# Patient Record
Sex: Female | Born: 1975 | Race: White | Hispanic: No | Marital: Single | Smoking: Current every day smoker
Health system: Southern US, Community
[De-identification: ages and names within clinical notes are randomized; demographics above are authoritative.]

## PROBLEM LIST (undated history)

## (undated) DIAGNOSIS — F329 Major depressive disorder, single episode, unspecified: Secondary | ICD-10-CM

## (undated) DIAGNOSIS — G43909 Migraine, unspecified, not intractable, without status migrainosus: Secondary | ICD-10-CM

## (undated) DIAGNOSIS — Z87442 Personal history of urinary calculi: Secondary | ICD-10-CM

## (undated) DIAGNOSIS — F32A Depression, unspecified: Secondary | ICD-10-CM

## (undated) DIAGNOSIS — I10 Essential (primary) hypertension: Secondary | ICD-10-CM

---

## 2007-05-31 ENCOUNTER — Emergency Department: Payer: Self-pay | Admitting: Emergency Medicine

## 2010-04-07 ENCOUNTER — Emergency Department: Payer: Self-pay | Admitting: Emergency Medicine

## 2011-05-03 ENCOUNTER — Emergency Department: Payer: Self-pay | Admitting: Unknown Physician Specialty

## 2011-05-11 ENCOUNTER — Emergency Department: Payer: Self-pay | Admitting: *Deleted

## 2012-07-05 ENCOUNTER — Emergency Department: Payer: Self-pay | Admitting: Emergency Medicine

## 2012-07-05 LAB — COMPREHENSIVE METABOLIC PANEL
Albumin: 3.5 g/dL (ref 3.4–5.0)
Alkaline Phosphatase: 92 U/L (ref 50–136)
BUN: 14 mg/dL (ref 7–18)
Bilirubin,Total: 0.2 mg/dL (ref 0.2–1.0)
Chloride: 109 mmol/L — ABNORMAL HIGH (ref 98–107)
Co2: 24 mmol/L (ref 21–32)
EGFR (African American): >60
Glucose: 78 mg/dL (ref 65–99)
Osmolality: 282 (ref 275–301)
SGOT(AST): 24 U/L (ref 15–37)
Total Protein: 7.6 g/dL (ref 6.4–8.2)

## 2012-07-05 LAB — URINALYSIS, COMPLETE
Bilirubin,UR: NEGATIVE
Glucose,UR: NEGATIVE mg/dL (ref 0–75)
Ph: 6 (ref 4.5–8.0)
Protein: NEGATIVE
RBC,UR: 1 /HPF (ref 0–5)
Specific Gravity: 1.005 (ref 1.003–1.030)
Squamous Epithelial: 1
WBC UR: 2 /HPF (ref 0–5)

## 2012-07-05 LAB — CBC
HCT: 39.2 % (ref 35.0–47.0)
HGB: 12.6 g/dL (ref 12.0–16.0)
Platelet: 211 10*3/uL (ref 150–440)
RBC: 4.7 10*6/uL (ref 3.80–5.20)
RDW: 14.4 % (ref 11.5–14.5)

## 2013-03-22 ENCOUNTER — Ambulatory Visit: Payer: Self-pay | Admitting: Family Medicine

## 2013-05-03 ENCOUNTER — Ambulatory Visit: Payer: Self-pay | Admitting: Family Medicine

## 2013-07-15 ENCOUNTER — Emergency Department (HOSPITAL_COMMUNITY)
Admission: EM | Admit: 2013-07-15 | Discharge: 2013-07-15 | Disposition: A | Discharge status: Home / Self Care | Payer: Self-pay | Attending: Emergency Medicine | Admitting: Emergency Medicine

## 2013-07-15 ENCOUNTER — Encounter (HOSPITAL_COMMUNITY): Payer: Self-pay | Admitting: Emergency Medicine

## 2013-07-15 DIAGNOSIS — X500XXA Overexertion from strenuous movement or load, initial encounter: Secondary | ICD-10-CM | POA: Insufficient documentation

## 2013-07-15 DIAGNOSIS — F3289 Other specified depressive episodes: Secondary | ICD-10-CM | POA: Insufficient documentation

## 2013-07-15 DIAGNOSIS — S39012A Strain of muscle, fascia and tendon of lower back, initial encounter: Secondary | ICD-10-CM

## 2013-07-15 DIAGNOSIS — Z79899 Other long term (current) drug therapy: Secondary | ICD-10-CM | POA: Insufficient documentation

## 2013-07-15 DIAGNOSIS — F17200 Nicotine dependence, unspecified, uncomplicated: Secondary | ICD-10-CM | POA: Insufficient documentation

## 2013-07-15 DIAGNOSIS — F329 Major depressive disorder, single episode, unspecified: Secondary | ICD-10-CM | POA: Insufficient documentation

## 2013-07-15 DIAGNOSIS — Y9389 Activity, other specified: Secondary | ICD-10-CM | POA: Insufficient documentation

## 2013-07-15 DIAGNOSIS — S335XXA Sprain of ligaments of lumbar spine, initial encounter: Secondary | ICD-10-CM | POA: Insufficient documentation

## 2013-07-15 DIAGNOSIS — G43909 Migraine, unspecified, not intractable, without status migrainosus: Secondary | ICD-10-CM | POA: Insufficient documentation

## 2013-07-15 DIAGNOSIS — Y929 Unspecified place or not applicable: Secondary | ICD-10-CM | POA: Insufficient documentation

## 2013-07-15 HISTORY — DX: Migraine, unspecified, not intractable, without status migrainosus: G43.909

## 2013-07-15 HISTORY — DX: Major depressive disorder, single episode, unspecified: F32.9

## 2013-07-15 HISTORY — DX: Depression, unspecified: F32.A

## 2013-07-15 MED ORDER — METHOCARBAMOL 500 MG PO TABS: ORAL_TABLET | ORAL | Status: DC

## 2013-07-15 MED ORDER — HYDROCODONE-ACETAMINOPHEN 5-325 MG PO TABS: ORAL_TABLET | ORAL | Status: DC

## 2013-07-15 NOTE — ED Notes (Signed)
Low back pain with radiation down rt leg. Pt says she was standing on top of a washing machine and bent over to reach the cord, 2 days ago.  Burgess Estelle.  Yesterday began having pain in her back.

## 2013-07-15 NOTE — ED Notes (Signed)
Pt c/o lower back pain radiating down right leg.

## 2013-07-15 NOTE — ED Provider Notes (Signed)
CSN: [id number]     Arrival date & time 07/15/13  1833 History   First MD Initiated Contact with Patient 07/15/13 1849     Chief Complaint  Patient presents with  . Back Pain   (Consider location/radiation/quality/duration/timing/severity/associated sxs/prior Treatment) Patient is a 38 y.o. female presenting with back pain. The history is provided by the patient.  Back Pain Location:  Lumbar spine Quality:  Shooting and aching Radiates to:  R posterior upper leg and R thigh Pain severity:  Moderate Pain is:  Same all the time Onset quality:  Sudden Duration:  2 days Timing:  Constant Progression:  Unchanged Chronicity:  New Context: recent injury and twisting   Relieved by:  Nothing Worsened by:  Bending, twisting and standing Ineffective treatments:  None tried Associated symptoms: leg pain   Associated symptoms: no abdominal pain, no abdominal swelling, no bladder incontinence, no bowel incontinence, no chest pain, no dysuria, no fever, no headaches, no numbness, no paresthesias, no pelvic pain, no perianal numbness, no tingling and no weakness     Past Medical History  Diagnosis Date  . Migraine   . Depression    Past Surgical History  Procedure Laterality Date  . Cesarean section     No family history on file. History  Substance Use Topics  . Smoking status: Current Every Day Smoker -- 0.50 packs/day    Types: Cigarettes  . Smokeless tobacco: Not on file  . Alcohol Use: Yes     Comment: rare   OB History   Grav Para Term Preterm Abortions TAB SAB Ect Mult Living                 Review of Systems  Constitutional: Negative for fever.  Respiratory: Negative for chest tightness and shortness of breath.   Cardiovascular: Negative for chest pain.  Gastrointestinal: Negative for vomiting, abdominal pain, constipation and bowel incontinence.  Genitourinary: Negative for bladder incontinence, dysuria, hematuria, flank pain, decreased urine volume, difficulty  urinating and pelvic pain.       No perineal numbness or incontinence of urine or feces  Musculoskeletal: Positive for back pain. Negative for joint swelling.  Skin: Negative for rash.  Neurological: Negative for tingling, weakness, numbness, headaches and paresthesias.  All other systems reviewed and are negative.    Allergies  Review of patient's allergies indicates no known allergies.  Home Medications   Current Outpatient Rx  Name  Route  Sig  Dispense  Refill  . sertraline (ZOLOFT) 100 MG tablet   Oral   Take 150 mg by mouth daily.         Marland Kitchen. topiramate (TOPAMAX) 50 MG tablet   Oral   Take 50 mg by mouth 2 (two) times daily.          BP 147/90  Pulse 67  Temp(Src) 98.2 F (36.8 C)  Resp 18  Ht 5\' 6"  (1.676 m)  Wt 240 lb (108.863 kg)  BMI 38.76 kg/m2  SpO2 100%  LMP 07/05/2013 Physical Exam  Nursing note and vitals reviewed. Constitutional: She is oriented to person, place, and time. She appears well-developed and well-nourished. No distress.  HENT:  Head: Normocephalic and atraumatic.  Neck: Normal range of motion. Neck supple.  Cardiovascular: Normal rate, regular rhythm, normal heart sounds and intact distal pulses.   No murmur heard. Pulmonary/Chest: Effort normal and breath sounds normal. No respiratory distress.  Abdominal: Soft. She exhibits no distension. There is no tenderness.  Musculoskeletal: She exhibits tenderness. She exhibits no  edema.       Lumbar back: She exhibits tenderness and pain. She exhibits normal range of motion, no swelling, no deformity, no laceration and normal pulse.  ttp of the right lumbar paraspinal muscles and SI joint.  No spinal tenderness.  DP pulses are brisk and symmetrical.  Distal sensation intact.  Hip Flexors/Extensors are intact  Neurological: She is alert and oriented to person, place, and time. She has normal strength. No sensory deficit. She exhibits normal muscle tone. Coordination and gait normal.  Reflex  Scores:      Patellar reflexes are 2+ on the right side and 2+ on the left side.      Achilles reflexes are 2+ on the right side and 2+ on the left side. Skin: Skin is warm and dry. No rash noted.    ED Course  Procedures (including critical care time) Labs Review Labs Reviewed - No data to display Imaging Review No results found.  EKG Interpretation   None       MDM    Patient has ttp of the right lumbar paraspinal muscles.  No focal neuro deficits on exam.  Ambulates with a steady gait.   No concerning symptoms for emergent neurological or infectious process.  Pt agrees to symptomatic treatment with robaxin, vicodin # 12,   Marna Weniger L. Kaydie Petsch, PA-C 07/17/13 0106

## 2013-07-15 NOTE — Discharge Instructions (Signed)
Lumbosacral Strain Lumbosacral strain is a strain of any of the parts that make up your lumbosacral vertebrae. Your lumbosacral vertebrae are the bones that make up the lower third of your backbone. Your lumbosacral vertebrae are held together by muscles and tough, fibrous tissue (ligaments).  CAUSES  A sudden blow to your back can cause lumbosacral strain. Also, anything that causes an excessive stretch of the muscles in the low back can cause this strain. This is typically seen when people exert themselves strenuously, fall, lift heavy objects, bend, or crouch repeatedly. RISK FACTORS  Physically demanding work.  Participation in pushing or pulling sports or sports that require sudden twist of the back (tennis, golf, baseball).  Weight lifting.  Excessive lower back curvature.  Forward-tilted pelvis.  Weak back or abdominal muscles or both.  Tight hamstrings. SIGNS AND SYMPTOMS  Lumbosacral strain may cause pain in the area of your injury or pain that moves (radiates) down your leg.  DIAGNOSIS Your health care provider can often diagnose lumbosacral strain through a physical exam. In some cases, you may need tests such as X-ray exams.  TREATMENT  Treatment for your lower back injury depends on many factors that your clinician will have to evaluate. However, most treatment will include the use of anti-inflammatory medicines. HOME CARE INSTRUCTIONS   Avoid hard physical activities (tennis, racquetball, waterskiing) if you are not in proper physical condition for it. This may aggravate or create problems.  If you have a back problem, avoid sports requiring sudden body movements. Swimming and walking are generally safer activities.  Maintain good posture.  Maintain a healthy weight.  For acute conditions, you may put ice on the injured area.  Put ice in a plastic bag.  Place a towel between your skin and the bag.  Leave the ice on for 20 minutes, 2 3 times a day.  When the  low back starts healing, stretching and strengthening exercises may be recommended. SEEK MEDICAL CARE IF:  Your back pain is getting worse.  You experience severe back pain not relieved with medicines. SEEK IMMEDIATE MEDICAL CARE IF:   You have numbness, tingling, weakness, or problems with the use of your arms or legs.  There is a change in bowel or bladder control.  You have increasing pain in any area of the body, including your belly (abdomen).  You notice shortness of breath, dizziness, or feel faint.  You feel sick to your stomach (nauseous), are throwing up (vomiting), or become sweaty.  You notice discoloration of your toes or legs, or your feet get very cold. MAKE SURE YOU:   Understand these instructions.  Will watch your condition.  Will get help right away if you are not doing well or get worse. Document Released: 03/27/2005 Document Revised: 04/07/2013 Document Reviewed: 02/03/2013 [employer] Patient Information 2014 [employer].  

## 2013-07-19 NOTE — ED Provider Notes (Signed)
Medical screening examination/treatment/procedure(s) were performed by non-physician practitioner and as supervising physician I was immediately available for consultation/collaboration.  Christopher J. Pollina, MD 07/19/13 0757 

## 2013-07-24 ENCOUNTER — Encounter (HOSPITAL_COMMUNITY): Payer: Self-pay | Admitting: Emergency Medicine

## 2013-07-24 ENCOUNTER — Emergency Department (HOSPITAL_COMMUNITY)
Admission: EM | Admit: 2013-07-24 | Discharge: 2013-07-24 | Disposition: A | Discharge status: Home / Self Care | Payer: Self-pay | Attending: Emergency Medicine | Admitting: Emergency Medicine

## 2013-07-24 DIAGNOSIS — F3289 Other specified depressive episodes: Secondary | ICD-10-CM | POA: Insufficient documentation

## 2013-07-24 DIAGNOSIS — F329 Major depressive disorder, single episode, unspecified: Secondary | ICD-10-CM | POA: Insufficient documentation

## 2013-07-24 DIAGNOSIS — M5431 Sciatica, right side: Secondary | ICD-10-CM

## 2013-07-24 DIAGNOSIS — M5430 Sciatica, unspecified side: Secondary | ICD-10-CM | POA: Insufficient documentation

## 2013-07-24 DIAGNOSIS — IMO0002 Reserved for concepts with insufficient information to code with codable children: Secondary | ICD-10-CM | POA: Insufficient documentation

## 2013-07-24 DIAGNOSIS — F17200 Nicotine dependence, unspecified, uncomplicated: Secondary | ICD-10-CM | POA: Insufficient documentation

## 2013-07-24 DIAGNOSIS — G43909 Migraine, unspecified, not intractable, without status migrainosus: Secondary | ICD-10-CM | POA: Insufficient documentation

## 2013-07-24 DIAGNOSIS — Z79899 Other long term (current) drug therapy: Secondary | ICD-10-CM | POA: Insufficient documentation

## 2013-07-24 MED ORDER — HYDROCODONE-ACETAMINOPHEN 5-325 MG PO TABS: 1.0000 | ORAL_TABLET | ORAL | Status: DC | PRN

## 2013-07-24 MED ORDER — PREDNISONE 10 MG PO TABS: ORAL_TABLET | ORAL | Status: DC

## 2013-07-24 MED ORDER — PREDNISONE 50 MG PO TABS
60.0000 mg | ORAL_TABLET | Freq: Once | ORAL | Status: AC
  Administered 2013-07-24: 60 mg via ORAL
  Filled 2013-07-24 (×2): qty 1

## 2013-07-24 MED ORDER — HYDROCODONE-ACETAMINOPHEN 5-325 MG PO TABS
1.0000 | ORAL_TABLET | Freq: Once | ORAL | Status: AC
  Administered 2013-07-24: 1 via ORAL
  Filled 2013-07-24: qty 1

## 2013-07-24 NOTE — ED Notes (Signed)
Was seen last Friday for back strain, given muscle relaxants.  Pain got better.  R leg pain has been ongoing since then, but today began having sharp, shooting pain from bottom of buttocks down back of leg to top of calf.  Standing makes it worse.  Sitting makes it a little better.

## 2013-07-24 NOTE — Discharge Instructions (Signed)
Sciatica °Sciatica is pain, weakness, numbness, or tingling along the path of the sciatic nerve. The nerve starts in the lower back and runs down the back of each leg. The nerve controls the muscles in the lower leg and in the back of the knee, while also providing sensation to the back of the thigh, lower leg, and the sole of your foot. Sciatica is a symptom of another medical condition. For instance, nerve damage or certain conditions, such as a herniated disk or bone spur on the spine, pinch or put pressure on the sciatic nerve. This causes the pain, weakness, or other sensations normally associated with sciatica. Generally, sciatica only affects one side of the body. °CAUSES  °· Herniated or slipped disc. °· Degenerative disk disease. °· A pain disorder involving the narrow muscle in the buttocks (piriformis syndrome). °· Pelvic injury or fracture. °· Pregnancy. °· Tumor (rare). °SYMPTOMS  °Symptoms can vary from mild to very severe. The symptoms usually travel from the low back to the buttocks and down the back of the leg. Symptoms can include: °· Mild tingling or dull aches in the lower back, leg, or hip. °· Numbness in the back of the calf or sole of the foot. °· Burning sensations in the lower back, leg, or hip. °· Sharp pains in the lower back, leg, or hip. °· Leg weakness. °· Severe back pain inhibiting movement. °These symptoms may get worse with coughing, sneezing, laughing, or prolonged sitting or standing. Also, being overweight may worsen symptoms. °DIAGNOSIS  °Your caregiver will perform a physical exam to look for common symptoms of sciatica. He or she may ask you to do certain movements or activities that would trigger sciatic nerve pain. Other tests may be performed to find the cause of the sciatica. These may include: °· Blood tests. °· X-rays. °· Imaging tests, such as an MRI or CT scan. °TREATMENT  °Treatment is directed at the cause of the sciatic pain. Sometimes, treatment is not necessary  and the pain and discomfort goes away on its own. If treatment is needed, your caregiver may suggest: °· Over-the-counter medicines to relieve pain. °· Prescription medicines, such as anti-inflammatory medicine, muscle relaxants, or narcotics. °· Applying heat or ice to the painful area. °· Steroid injections to lessen pain, irritation, and inflammation around the nerve. °· Reducing activity during periods of pain. °· Exercising and stretching to strengthen your abdomen and improve flexibility of your spine. Your caregiver may suggest losing weight if the extra weight makes the back pain worse. °· Physical therapy. °· Surgery to eliminate what is pressing or pinching the nerve, such as a bone spur or part of a herniated disk. °HOME CARE INSTRUCTIONS  °· Only take over-the-counter or prescription medicines for pain or discomfort as directed by your caregiver. °· Apply ice to the affected area for 20 minutes, 3 4 times a day for the first 48 72 hours. Then try heat in the same way. °· Exercise, stretch, or perform your usual activities if these do not aggravate your pain. °· Attend physical therapy sessions as directed by your caregiver. °· Keep all follow-up appointments as directed by your caregiver. °· Do not wear high heels or shoes that do not provide proper support. °· Check your mattress to see if it is too soft. A firm mattress may lessen your pain and discomfort. °SEEK IMMEDIATE MEDICAL CARE IF:  °· You lose control of your bowel or bladder (incontinence). °· You have increasing weakness in the lower back,   pelvis, buttocks, or legs.  You have redness or swelling of your back.  You have a burning sensation when you urinate.  You have pain that gets worse when you lie down or awakens you at night.  Your pain is worse than you have experienced in the past.  Your pain is lasting longer than 4 weeks.  You are suddenly losing weight without reason. MAKE SURE YOU:  Understand these  instructions.  Will watch your condition.  Will get help right away if you are not doing well or get worse. Document Released: 06/11/2001 Document Revised: 12/17/2011 Document Reviewed: 10/27/2011 [facility] Patient Information 2014 [place], [place].   Take your next dose of prednisone tomorrow evening.  Use the the other medicines as directed.  Do not drive within 4 hours of taking hydrocodone as this will make you drowsy.  Avoid lifting,  Bending,  Twisting or any other activity that worsens your pain over the next week.  Apply a heating pad to your lower back 20 minutes 3 times daily.  You should get rechecked if your symptoms are not better over the next 5 days,  Or you develop increased pain,  Weakness in your leg(s) or loss of bladder or bowel function - these are symptoms of a worse injury.

## 2013-07-27 NOTE — ED Provider Notes (Signed)
CSN: [id number]     Arrival date & time 07/24/13  1856 History   First MD Initiated Contact with Patient 07/24/13 2001     Chief Complaint  Patient presents with  . Leg Pain   (Consider location/radiation/quality/duration/timing/severity/associated sxs/prior Treatment) HPI Comments: Stephanie Kaufman is a 38 y.o. Female presenting with acute on chronic low back pain with radiation to her right posteriolateral lower thigh which has which has been present for the past week.  She was seen here last week for similar symptoms including lumbar pain which is now better, but the radiation into the right leg is persistent.   Patient denies any new injury specifically.  There has been no weakness or numbness in the lower extremities and no urinary or bowel retention or incontinence.  Patient does not have a history of cancer or IVDU. She is taking robaxin which does not improve her symptoms.      The history is provided by the patient and the spouse.    Past Medical History  Diagnosis Date  . Migraine   . Depression    Past Surgical History  Procedure Laterality Date  . Cesarean section     History reviewed. No pertinent family history. History  Substance Use Topics  . Smoking status: Current Every Day Smoker -- 0.50 packs/day    Types: Cigarettes  . Smokeless tobacco: Not on file  . Alcohol Use: Yes     Comment: rare   OB History   Grav Para Term Preterm Abortions TAB SAB Ect Mult Living                 Review of Systems  Constitutional: Negative for fever.  Respiratory: Negative for shortness of breath.   Cardiovascular: Negative for chest pain and leg swelling.  Gastrointestinal: Negative for abdominal pain, constipation and abdominal distention.  Genitourinary: Negative for dysuria, urgency, frequency, flank pain and difficulty urinating.  Musculoskeletal: Positive for back pain. Negative for gait problem and joint swelling.  Skin: Negative for rash.  Neurological: Negative for  weakness and numbness.    Allergies  Review of patient's allergies indicates no known allergies.  Home Medications   Current Outpatient Rx  Name  Route  Sig  Dispense  Refill  . methocarbamol (ROBAXIN) 500 MG tablet   Oral   Take 1,000 mg by mouth 3 (three) times daily.         . sertraline (ZOLOFT) 100 MG tablet   Oral   Take 150 mg by mouth daily.         Marland Kitchen. topiramate (TOPAMAX) 50 MG tablet   Oral   Take 50 mg by mouth 2 (two) times daily.         Marland Kitchen. HYDROcodone-acetaminophen (NORCO/VICODIN) 5-325 MG per tablet   Oral   Take 1 tablet by mouth every 4 (four) hours as needed for moderate pain.   20 tablet   0   . predniSONE (DELTASONE) 10 MG tablet      6, 5, 4, 3, 2 then 1 tablet by mouth daily for 6 days total.   21 tablet   0    BP 151/85  Pulse 68  Temp(Src) 98.1 F (36.7 C) (Oral)  Resp 16  Ht 5\' 6"  (1.676 m)  Wt 230 lb (104.327 kg)  BMI 37.14 kg/m2  SpO2 100%  LMP 07/05/2013 Physical Exam  Nursing note and vitals reviewed. Constitutional: She appears well-developed and well-nourished.  HENT:  Head: Normocephalic.  Eyes: Conjunctivae are normal.  Neck:  Normal range of motion. Neck supple.  Cardiovascular: Normal rate and intact distal pulses.   Pedal pulses normal.  Pulmonary/Chest: Effort normal.  Abdominal: Soft. Bowel sounds are normal. She exhibits no distension and no mass.  Musculoskeletal: Normal range of motion. She exhibits no edema.       Lumbar back: She exhibits tenderness. She exhibits no bony tenderness, no swelling, no edema and no spasm.  ttp  Right sciatic notch with palpation, radiates into right posterior thigh.  No leg or ankle edema.  Neg SLR.    Neurological: She is alert. She has normal strength. She displays no atrophy and no tremor. No sensory deficit. Gait normal.  Reflex Scores:      Patellar reflexes are 2+ on the right side and 2+ on the left side.      Achilles reflexes are 2+ on the right side and 2+ on the left  side. No strength deficit noted in hip and knee flexor and extensor muscle groups.  Ankle flexion and extension intact.  Skin: Skin is warm and dry.  Psychiatric: She has a normal mood and affect.    ED Course  Procedures (including critical care time) Labs Review Labs Reviewed - No data to display Imaging Review No results found.  EKG Interpretation   None       MDM   1. Sciatica of right side    No neuro deficit on exam or by history to suggest emergent or surgical presentation.  Also discussed worsened sx that should prompt immediate re-evaluation including distal weakness, bowel/bladder retention/incontinence.  Encouraged continued robaxin,  Added prednisone taper, hydrocodone.  Heating pad.  F/u with pcp for a recheck if not better over the next week.    Burgess Amor, PA-C 07/27/13 2146

## 2013-07-28 ENCOUNTER — Emergency Department (HOSPITAL_COMMUNITY)
Admission: EM | Admit: 2013-07-28 | Discharge: 2013-07-28 | Discharge status: Against Medical Advice | Payer: Self-pay | Attending: Emergency Medicine | Admitting: Emergency Medicine

## 2013-07-28 ENCOUNTER — Encounter (HOSPITAL_COMMUNITY): Payer: Self-pay | Admitting: Emergency Medicine

## 2013-07-28 DIAGNOSIS — M79609 Pain in unspecified limb: Secondary | ICD-10-CM | POA: Insufficient documentation

## 2013-07-28 DIAGNOSIS — F17200 Nicotine dependence, unspecified, uncomplicated: Secondary | ICD-10-CM | POA: Insufficient documentation

## 2013-07-28 NOTE — ED Notes (Signed)
Called patient from main waiting room to take to [facility] with no response.

## 2013-07-28 NOTE — ED Notes (Signed)
Called patient from waiting room for [facility] Room #24 with no answer.

## 2013-07-28 NOTE — ED Notes (Signed)
Pt c/o right leg pain x2 weeks. Pt states "my leg started hurting when I injured my back". Pt currently denies back pain. Pt also states "when I walk it feels like my leg is going to give out". Pt was seen here Sunday for same symptoms.

## 2013-07-29 ENCOUNTER — Emergency Department (HOSPITAL_COMMUNITY)
Admission: EM | Admit: 2013-07-29 | Discharge: 2013-07-29 | Disposition: A | Discharge status: Home / Self Care | Payer: Self-pay | Attending: Emergency Medicine | Admitting: Emergency Medicine

## 2013-07-29 ENCOUNTER — Encounter (HOSPITAL_COMMUNITY): Payer: Self-pay | Admitting: Emergency Medicine

## 2013-07-29 DIAGNOSIS — G43909 Migraine, unspecified, not intractable, without status migrainosus: Secondary | ICD-10-CM | POA: Insufficient documentation

## 2013-07-29 DIAGNOSIS — M5430 Sciatica, unspecified side: Secondary | ICD-10-CM | POA: Insufficient documentation

## 2013-07-29 DIAGNOSIS — F3289 Other specified depressive episodes: Secondary | ICD-10-CM | POA: Insufficient documentation

## 2013-07-29 DIAGNOSIS — F329 Major depressive disorder, single episode, unspecified: Secondary | ICD-10-CM | POA: Insufficient documentation

## 2013-07-29 DIAGNOSIS — F17200 Nicotine dependence, unspecified, uncomplicated: Secondary | ICD-10-CM | POA: Insufficient documentation

## 2013-07-29 DIAGNOSIS — Z79899 Other long term (current) drug therapy: Secondary | ICD-10-CM | POA: Insufficient documentation

## 2013-07-29 MED ORDER — CYCLOBENZAPRINE HCL 10 MG PO TABS: 10.0000 mg | ORAL_TABLET | Freq: Three times a day (TID) | ORAL | Status: DC | PRN

## 2013-07-29 MED ORDER — PREDNISONE 50 MG PO TABS: 50.0000 mg | ORAL_TABLET | Freq: Every day | ORAL | Status: DC

## 2013-07-29 MED ORDER — DEXAMETHASONE SODIUM PHOSPHATE 10 MG/ML IJ SOLN
10.0000 mg | Freq: Once | INTRAMUSCULAR | Status: AC
  Administered 2013-07-29: 10 mg via INTRAMUSCULAR
  Filled 2013-07-29: qty 1

## 2013-07-29 MED ORDER — OXYCODONE-ACETAMINOPHEN 5-325 MG PO TABS: 1.0000 | ORAL_TABLET | Freq: Four times a day (QID) | ORAL | Status: DC | PRN

## 2013-07-29 MED ORDER — MORPHINE SULFATE 4 MG/ML IJ SOLN
6.0000 mg | Freq: Once | INTRAMUSCULAR | Status: AC
  Administered 2013-07-29: 6 mg via INTRAMUSCULAR
  Filled 2013-07-29: qty 2

## 2013-07-29 NOTE — ED Provider Notes (Signed)
Medical screening examination/treatment/procedure(s) were performed by non-physician practitioner and as supervising physician I was immediately available for consultation/collaboration.  EKG Interpretation   None         Arvis Miguez L Nyomie Ehrlich, MD 07/29/13 1112 

## 2013-07-29 NOTE — ED Notes (Signed)
Pt c/o rt leg pain. See and treated on Sunday for same. Pt reports came yesterday but left after triage. Pt still c/o rt leg pain. Ambulated to triage without difficulty.

## 2013-07-29 NOTE — ED Provider Notes (Signed)
CSN: [id number]     Arrival date & time 07/29/13  0957 History   First MD Initiated Contact with Patient 07/29/13 1016     Chief Complaint  Patient presents with  . Leg Pain   (Consider location/radiation/quality/duration/timing/severity/associated sxs/prior Treatment) HPI Patient presents emergency department with lower back pain that radiates to her right leg.  Patient, states, the right leg  Discomfort is more significant than it had been previously.  Patient, states she's not had any fever, nausea, vomiting, abdominal pain, incontinence, numbness, weakness, dizziness, fever, or neck pain.  The patient, states, that initially she strained her back while climbing on her washing machine to reach for a cord.  Patient, states she has not taken any other medications other than the ones prescribed.  Patient, states, that movement and palpation make her pain, worse Past Medical History  Diagnosis Date  . Migraine   . Depression    Past Surgical History  Procedure Laterality Date  . Cesarean section     No family history on file. History  Substance Use Topics  . Smoking status: Current Every Day Smoker -- 0.50 packs/day    Types: Cigarettes  . Smokeless tobacco: Not on file  . Alcohol Use: Yes     Comment: rare   OB History   Grav Para Term Preterm Abortions TAB SAB Ect Mult Living                 Review of Systems All other systems negative except as documented in the HPI. All pertinent positives and negatives as reviewed in the HPI. Allergies  Review of patient's allergies indicates no known allergies.  Home Medications   Current Outpatient Rx  Name  Route  Sig  Dispense  Refill  . HYDROcodone-acetaminophen (NORCO/VICODIN) 5-325 MG per tablet   Oral   Take 1 tablet by mouth every 4 (four) hours as needed for moderate pain.   20 tablet   0   . sertraline (ZOLOFT) 100 MG tablet   Oral   Take 150 mg by mouth daily.         Marland Kitchen topiramate (TOPAMAX) 50 MG tablet   Oral  Take 50 mg by mouth 2 (two) times daily.          BP 150/95  Pulse 63  Temp(Src) 98.3 F (36.8 C) (Oral)  Resp 16  SpO2 100%  LMP 07/05/2013 Physical Exam  Constitutional: She is oriented to person, place, and time. She appears well-developed and well-nourished. No distress.  Eyes: Pupils are equal, round, and reactive to light.  Cardiovascular: Normal rate and regular rhythm.   Pulmonary/Chest: Effort normal and breath sounds normal.  Musculoskeletal:       Lumbar back: She exhibits tenderness and pain. She exhibits normal range of motion, no bony tenderness, no deformity and no spasm.       Back:  Neurological: She is alert and oriented to person, place, and time. She has normal reflexes. She exhibits normal muscle tone. Coordination normal.  Skin: Skin is warm and dry.    ED Course  Procedures (including critical care time) Patient be treated for sciatica.  Advised, patient, she'll need to stay for a few more, days.  She'll need to avoid heavy lifting over the next 2 weeks.  Also give her followup with orthopedics, and I advised her to followup with her primary care Dr. to reassess and determine the need for possible MRI.  The patient has no neurological deficits noted on exam, shows normal  reflexes and normal sensation intact in both lower extremities.  Patient is advised to turn here as needed.  Questions were answered and the patient voiced an understanding of the plan  Carlyle DollyChristopher W Donivin Wirt, PA-C 07/29/13 1103

## 2013-07-29 NOTE — Discharge Instructions (Signed)
Return here as needed.  Followup with your doctor in the Dr. provided.  Use ice and heat on your lower back

## 2013-07-30 NOTE — ED Provider Notes (Signed)
Medical screening examination/treatment/procedure(s) were performed by non-physician practitioner and as supervising physician I was immediately available for consultation/collaboration.  EKG Interpretation   None        Shelda JakesScott W. Jefferie Holston, MD 07/30/13 [phone]

## 2014-06-28 ENCOUNTER — Emergency Department: Payer: Self-pay | Admitting: Emergency Medicine

## 2014-06-28 LAB — COMPREHENSIVE METABOLIC PANEL
ALBUMIN: 3.6 g/dL (ref 3.4–5.0)
ALK PHOS: 87 U/L
ALT: 19 U/L
ANION GAP: 7 (ref 7–16)
AST: 12 U/L — AB (ref 15–37)
BILIRUBIN TOTAL: 0.3 mg/dL (ref 0.2–1.0)
BUN: 13 mg/dL (ref 7–18)
CALCIUM: 8.4 mg/dL — AB (ref 8.5–10.1)
CO2: 24 mmol/L (ref 21–32)
CREATININE: 0.94 mg/dL (ref 0.60–1.30)
Chloride: 108 mmol/L — ABNORMAL HIGH (ref 98–107)
EGFR (Non-African Amer.): >60
Glucose: 99 mg/dL (ref 65–99)
Osmolality: 278 (ref 275–301)
Potassium: 3.8 mmol/L (ref 3.5–5.1)
SODIUM: 139 mmol/L (ref 136–145)
Total Protein: 7.4 g/dL (ref 6.4–8.2)

## 2014-06-28 LAB — CBC WITH DIFFERENTIAL/PLATELET
Basophil #: 0.1 10*3/uL (ref 0.0–0.1)
Basophil %: 0.9 %
EOS ABS: 0.3 10*3/uL (ref 0.0–0.7)
Eosinophil %: 3.4 %
HCT: 37.7 % (ref 35.0–47.0)
HGB: 11.9 g/dL — AB (ref 12.0–16.0)
LYMPHS PCT: 26.6 %
Lymphocyte #: 2.2 10*3/uL (ref 1.0–3.6)
MCH: 25.6 pg — AB (ref 26.0–34.0)
MCHC: 31.7 g/dL — AB (ref 32.0–36.0)
MCV: 81 fL (ref 80–100)
MONO ABS: 0.6 x10 3/mm (ref 0.2–0.9)
Monocyte %: 7.4 %
Neutrophil #: 5.2 10*3/uL (ref 1.4–6.5)
Neutrophil %: 61.7 %
PLATELETS: 233 10*3/uL (ref 150–440)
RBC: 4.67 10*6/uL (ref 3.80–5.20)
RDW: 15.6 % — ABNORMAL HIGH (ref 11.5–14.5)
WBC: 8.4 10*3/uL (ref 3.6–11.0)

## 2014-06-28 LAB — URINALYSIS, COMPLETE
BACTERIA: NONE SEEN
Bilirubin,UR: NEGATIVE
Glucose,UR: NEGATIVE mg/dL (ref 0–75)
Ketone: NEGATIVE
LEUKOCYTE ESTERASE: NEGATIVE
NITRITE: NEGATIVE
PH: 5 (ref 4.5–8.0)
Protein: NEGATIVE
RBC,UR: 3 /HPF (ref 0–5)
Specific Gravity: 1.015 (ref 1.003–1.030)
Squamous Epithelial: 1

## 2015-02-03 ENCOUNTER — Encounter: Payer: Self-pay | Admitting: Emergency Medicine

## 2015-02-03 ENCOUNTER — Emergency Department
Admission: EM | Admit: 2015-02-03 | Discharge: 2015-02-03 | Disposition: A | Discharge status: Home / Self Care | Payer: Self-pay | Attending: Student | Admitting: Student

## 2015-02-03 DIAGNOSIS — K05219 Aggressive periodontitis, localized, unspecified severity: Secondary | ICD-10-CM

## 2015-02-03 DIAGNOSIS — K0889 Other specified disorders of teeth and supporting structures: Secondary | ICD-10-CM

## 2015-02-03 DIAGNOSIS — Z79811 Long term (current) use of aromatase inhibitors: Secondary | ICD-10-CM | POA: Insufficient documentation

## 2015-02-03 DIAGNOSIS — K088 Other specified disorders of teeth and supporting structures: Secondary | ICD-10-CM | POA: Insufficient documentation

## 2015-02-03 DIAGNOSIS — Z79899 Other long term (current) drug therapy: Secondary | ICD-10-CM | POA: Insufficient documentation

## 2015-02-03 DIAGNOSIS — Z7951 Long term (current) use of inhaled steroids: Secondary | ICD-10-CM | POA: Insufficient documentation

## 2015-02-03 DIAGNOSIS — Z720 Tobacco use: Secondary | ICD-10-CM | POA: Insufficient documentation

## 2015-02-03 DIAGNOSIS — K0521 Aggressive periodontitis, localized: Secondary | ICD-10-CM | POA: Insufficient documentation

## 2015-02-03 MED ORDER — PENICILLIN V POTASSIUM 500 MG PO TABS: 500.0000 mg | ORAL_TABLET | Freq: Four times a day (QID) | ORAL | Status: DC

## 2015-02-03 MED ORDER — KETOROLAC TROMETHAMINE 10 MG PO TABS: 10.0000 mg | ORAL_TABLET | Freq: Three times a day (TID) | ORAL | Status: DC

## 2015-02-03 MED ORDER — LIDOCAINE VISCOUS 2 % MT SOLN
15.0000 mL | Freq: Once | OROMUCOSAL | Status: AC
  Administered 2015-02-03: 15 mL via OROMUCOSAL
  Filled 2015-02-03: qty 15

## 2015-02-03 NOTE — ED Provider Notes (Signed)
[facility] Emergency Department Provider Note ____________________________________________  Time seen: 1455  I have reviewed the triage vital signs and the nursing notes.  HISTORY  Chief Complaint  Dental Pain  HPI Stephanie Kaufman is a 39 y.o. female reports to the ED with 2 days complaint of pain and swelling to the gumline on the left upper canine tooth. She denies any known injury or trauma, but has noticed some swelling to the lingual side of the gum at that particular tooth. She at one point, noticed some scant purulent discharge when she expressed the area with her hand.Since that time she's had some increased pain on the entire jaw line on the left upper side. She denies any fever, chills, sweats, or nausea. Due to pain at a 9/10 in triage.  Past Medical History  Diagnosis Date  . Migraine   . Depression    There are no active problems to display for this patient.  Past Surgical History  Procedure Laterality Date  . Cesarean section      Current Outpatient Rx  Name  Route  Sig  Dispense  Refill  . cyclobenzaprine (FLEXERIL) 10 MG tablet   Oral   Take 1 tablet (10 mg total) by mouth 3 (three) times daily as needed for muscle spasms.   15 tablet   0   . HYDROcodone-acetaminophen (NORCO/VICODIN) 5-325 MG per tablet   Oral   Take 1 tablet by mouth every 4 (four) hours as needed for moderate pain.   20 tablet   0   . ketorolac (TORADOL) 10 MG tablet   Oral   Take 1 tablet (10 mg total) by mouth every 8 (eight) hours.   15 tablet   0   . oxyCODONE-acetaminophen (PERCOCET/ROXICET) 5-325 MG per tablet   Oral   Take 1 tablet by mouth every 6 (six) hours as needed for severe pain.   15 tablet   0   . penicillin v potassium (VEETID) 500 MG tablet   Oral   Take 1 tablet (500 mg total) by mouth 4 (four) times daily.   40 tablet   0   . predniSONE (DELTASONE) 50 MG tablet   Oral   Take 1 tablet (50 mg total) by mouth daily.   5 tablet    0   . sertraline (ZOLOFT) 100 MG tablet   Oral   Take 150 mg by mouth daily.         Marland Kitchen topiramate (TOPAMAX) 50 MG tablet   Oral   Take 50 mg by mouth 2 (two) times daily.          Allergies Review of patient's allergies indicates no known allergies.  No family history on file.  Social History History  Substance Use Topics  . Smoking status: Current Every Day Smoker -- 0.50 packs/day    Types: Cigarettes  . Smokeless tobacco: Not on file  . Alcohol Use: Yes     Comment: rare   Review of Systems  Constitutional: Negative for fever. Eyes: Negative for visual changes. ENT: Negative for sore throat. Gum swelling as above. Cardiovascular: Negative for chest pain. Respiratory: Negative for shortness of breath. Gastrointestinal: Negative for abdominal pain, vomiting and diarrhea. Genitourinary: Negative for dysuria. Musculoskeletal: Negative for back pain. Skin: Negative for rash. Neurological: Negative for headaches, focal weakness or numbness. ____________________________________________  PHYSICAL EXAM:  VITAL SIGNS: ED Triage Vitals  Enc Vitals Group     BP 02/03/15 1513 155/99 mmHg  Pulse Rate 02/03/15 1513 89     Resp 02/03/15 1513 18     Temp 02/03/15 1513 98.6 F (37 C)     Temp src --      SpO2 02/03/15 1513 100 %     Weight 02/03/15 1513 250 lb (113.399 kg)     Height 02/03/15 1513 5\' 6"  (1.676 m)     Head Cir --      Peak Flow --      Pain Score 02/03/15 1509 7     Pain Loc --      Pain Edu? --      Excl. in GC? --    Constitutional: Alert and oriented. Well appearing and in no distress. Eyes: Conjunctivae are normal. PERRL. Normal extraocular movements. ENT   Head: Normocephalic and atraumatic.   Nose: No congestion/rhinnorhea.   Mouth/Throat: Mucous membranes are moist. Good dentition without dental caries. Local swelling to the lingual side of the gum at the upper left canine. No dental injury, fracture, or cavity.    Neck:  Supple. No thyromegaly. Hematological/Lymphatic/Immunilogical: No cervical lymphadenopathy. Cardiovascular: Normal rate, regular rhythm.  Respiratory: Normal respiratory effort. No wheezes/rales/rhonchi. Gastrointestinal: Soft and nontender. No distention. Musculoskeletal: Nontender with normal range of motion in all extremities.  Neurologic:  Normal gait without ataxia. Normal speech and language. No gross focal neurologic deficits are appreciated. Skin:  Skin is warm, dry and intact. No rash noted. Psychiatric: Mood and affect are normal. Patient exhibits appropriate insight and judgment. ____________________________________________  PROCEDURES  2% lidocaine gargle ____________________________________________  INITIAL IMPRESSION / ASSESSMENT AND PLAN / ED COURSE  Acute dental pain due to local gum injury/abscess. Treatment with Pen VK & Toradol #15. Dental provider list given at discharge.  ____________________________________________  FINAL CLINICAL IMPRESSION(S) / ED DIAGNOSES  Final diagnoses:  Pain, dental  Abscess of upper gum     Lissa Hoard, PA-C 02/03/15 1734  Gayla Doss, MD 02/03/15 2145

## 2015-02-03 NOTE — Discharge Instructions (Signed)
Dental Pain A tooth ache may be caused by cavities (tooth decay). Cavities expose the nerve of the tooth to air and hot or cold temperatures. It may come from an infection or abscess (also called a boil or furuncle) around your tooth. It is also often caused by dental caries (tooth decay). This causes the pain you are having. DIAGNOSIS  Your caregiver can diagnose this problem by exam. TREATMENT   If caused by an infection, it may be treated with medications which kill germs (antibiotics) and pain medications as prescribed by your caregiver. Take medications as directed.  Only take over-the-counter or prescription medicines for pain, discomfort, or fever as directed by your caregiver.  Whether the tooth ache today is caused by infection or dental disease, you should see your dentist as soon as possible for further care. SEEK MEDICAL CARE IF: The exam and treatment you received today has been provided on an emergency basis only. This is not a substitute for complete medical or dental care. If your problem worsens or new problems (symptoms) appear, and you are unable to meet with your dentist, call or return to this location. SEEK IMMEDIATE MEDICAL CARE IF:   You have a fever.  You develop redness and swelling of your face, jaw, or neck.  You are unable to open your mouth.  You have severe pain uncontrolled by pain medicine. MAKE SURE YOU:   Understand these instructions.  Will watch your condition.  Will get help right away if you are not doing well or get worse. Document Released: 06/17/2005 Document Revised: 09/09/2011 Document Reviewed: 02/03/2008 [facility] Patient Information 2015 [place], [place]. This information is not intended to replace advice given to you by your health care provider. Make sure you discuss any questions you have with your health care provider.   Gingivitis  Gingivitis is an infection of the teeth and bones that support the teeth. Your gums become red, sore,  and puffy (swollen). It is caused by germs that build up on your teeth and gums (plaque). HOME CARE  Floss and then brush your teeth.  Brush at least twice a day.  Floss at least once a day.  Avoid sugar between meals.  Do not drink juice before bed. Only drink water.  Make and keep your regular checkups and cleanings with your dentist.  Use any mouth care product or toothpaste as told by your dentist. GET HELP RIGHT AWAY IF:  You have painful, red tissue around your teeth.  You have trouble chewing.  You have loose or infected teeth. MAKE SURE YOU:  Understand these instructions.  Will watch your condition.  Will get help right away if you are not doing well or get worse. Document Released: 07/20/2010 Document Revised: 09/09/2011 Document Reviewed: 09/21/2010 [facility] Patient Information 2015 [place], [place]. This information is not intended to replace advice given to you by your health care provider. Make sure you discuss any questions you have with your health care provider.  Take the antibiotic as directed. Rinse with warm, salty water to reduce pain & swelling.  Take the pain medicine as needed for pain & inflammation.

## 2015-02-03 NOTE — ED Notes (Signed)
States she developed toothache couple of days ago. Gum swelling

## 2016-06-06 ENCOUNTER — Encounter: Payer: Self-pay | Admitting: *Deleted

## 2016-06-06 ENCOUNTER — Emergency Department
Admission: EM | Admit: 2016-06-06 | Discharge: 2016-06-06 | Disposition: A | Discharge status: Home / Self Care | Payer: Self-pay | Attending: Emergency Medicine | Admitting: Emergency Medicine

## 2016-06-06 DIAGNOSIS — M5431 Sciatica, right side: Secondary | ICD-10-CM

## 2016-06-06 DIAGNOSIS — I10 Essential (primary) hypertension: Secondary | ICD-10-CM | POA: Insufficient documentation

## 2016-06-06 DIAGNOSIS — F17210 Nicotine dependence, cigarettes, uncomplicated: Secondary | ICD-10-CM | POA: Insufficient documentation

## 2016-06-06 DIAGNOSIS — M5441 Lumbago with sciatica, right side: Secondary | ICD-10-CM | POA: Insufficient documentation

## 2016-06-06 DIAGNOSIS — Z79899 Other long term (current) drug therapy: Secondary | ICD-10-CM | POA: Insufficient documentation

## 2016-06-06 HISTORY — DX: Essential (primary) hypertension: I10

## 2016-06-06 MED ORDER — KETOROLAC TROMETHAMINE 30 MG/ML IJ SOLN
30.0000 mg | Freq: Once | INTRAMUSCULAR | Status: AC
  Administered 2016-06-06: 30 mg via INTRAMUSCULAR
  Filled 2016-06-06: qty 1

## 2016-06-06 MED ORDER — NAPROXEN 500 MG PO TABS: 500.0000 mg | ORAL_TABLET | Freq: Two times a day (BID) | ORAL | 0 refills | Status: DC

## 2016-06-06 MED ORDER — CYCLOBENZAPRINE HCL 10 MG PO TABS: 10.0000 mg | ORAL_TABLET | Freq: Three times a day (TID) | ORAL | 0 refills | Status: DC | PRN

## 2016-06-06 NOTE — ED Notes (Signed)
AAOx3.  Skin warm and dry. NAD.  Ambulates with easy and steady gait.   

## 2016-06-06 NOTE — ED Provider Notes (Signed)
[facility] Emergency Department Provider Note  ____________________________________________  Time seen: Approximately 3:14 PM  I have reviewed the triage vital signs and the nursing notes.   HISTORY  Chief Complaint Back Pain    HPI Stephanie Kaufman is a 40 y.o. female , NAD, presents to the emergency department with 3 day history of lower back pain. Patient states she has a history of sciatica in which her last flare was a few years ago. Has onset of lower back pain on Monday that is increasingly worsened over the last few days. Now has shooting pain down the right buttock and leg. States this episode is less severe as her last episode. Denies any injuries, traumas or falls. Has not noted any redness, swelling, rashes or skin sores. Denies any saddle paresthesias or loss of bowel or bladder control. Denies any numbness, weakness, tingling in the lower extremities. Has had no fevers, chills or body aches. Denies any dysuria, hematuria or changes in urinary or bowel habits.    Past Medical History:  Diagnosis Date  . Depression   . Hypertension   . Migraine     There are no active problems to display for this patient.   Past Surgical History:  Procedure Laterality Date  . CESAREAN SECTION      Prior to Admission medications   Medication Sig Start Date End Date Taking? Authorizing Provider  cyclobenzaprine (FLEXERIL) 10 MG tablet Take 1 tablet (10 mg total) by mouth 3 (three) times daily as needed for muscle spasms. 06/06/16   Jami L Hagler, PA-C  HYDROcodone-acetaminophen (NORCO/VICODIN) 5-325 MG per tablet Take 1 tablet by mouth every 4 (four) hours as needed for moderate pain. 07/24/13   Burgess Amor, PA-C  ketorolac (TORADOL) 10 MG tablet Take 1 tablet (10 mg total) by mouth every 8 (eight) hours. 02/03/15   Jenise V Bacon Menshew, PA-C  naproxen (NAPROSYN) 500 MG tablet Take 1 tablet (500 mg total) by mouth 2 (two) times daily with a meal. 06/06/16   Jami L  Hagler, PA-C  oxyCODONE-acetaminophen (PERCOCET/ROXICET) 5-325 MG per tablet Take 1 tablet by mouth every 6 (six) hours as needed for severe pain. 07/29/13   Charlestine Night, PA-C  penicillin v potassium (VEETID) 500 MG tablet Take 1 tablet (500 mg total) by mouth 4 (four) times daily. 02/03/15   Jenise V Bacon Menshew, PA-C  predniSONE (DELTASONE) 50 MG tablet Take 1 tablet (50 mg total) by mouth daily. 07/29/13   Charlestine Night, PA-C  sertraline (ZOLOFT) 100 MG tablet Take 150 mg by mouth daily.    Historical Provider, MD  topiramate (TOPAMAX) 50 MG tablet Take 50 mg by mouth 2 (two) times daily.    Historical Provider, MD    Allergies Patient has no known allergies.  No family history on file.  Social History Social History  Substance Use Topics  . Smoking status: Current Every Day Smoker    Packs/day: 0.50    Types: Cigarettes  . Smokeless tobacco: Not on file  . Alcohol use Yes     Comment: rare     Review of Systems  Constitutional: No fever/chills Cardiovascular: No chest pain. Respiratory: No shortness of breath.  Gastrointestinal: No abdominal pain.  No nausea, vomiting.  No diarrhea.  No constipation. Genitourinary: Negative for dysuria, hematuria. No urinary hesitancy, urgency or increased frequency. Musculoskeletal: Positive for back pain.  Skin: Negative for rash, redness, swelling, skin sores. Neurological: Negative for saddle paresthesias or loss of bowel or bladder control.  No numbness, weakness, tingling 10-point ROS otherwise negative.  ____________________________________________   PHYSICAL EXAM:  VITAL SIGNS: ED Triage Vitals  Enc Vitals Group     BP 06/06/16 1219 (!) 161/100     Pulse Rate 06/06/16 1219 (!) 103     Resp 06/06/16 1219 20     Temp 06/06/16 1219 99.2 F (37.3 C)     Temp Source 06/06/16 1219 Oral     SpO2 06/06/16 1219 100 %     Weight 06/06/16 1220 225 lb (102.1 kg)     Height 06/06/16 1220 5\' 7"  (1.702 m)     Head  Circumference --      Peak Flow --      Pain Score 06/06/16 1220 7     Pain Loc --      Pain Edu? --      Excl. in GC? --     Vital signs at discharge with blood pressure 114/66 and pulse rate of 84.   Constitutional: Alert and oriented. Well appearing and in no acute distress. Eyes: Conjunctivae are normal Without icterus or injection. Head: Atraumatic. Neck: Supple with full range of motion. Hematological/Lymphatic/Immunilogical: No cervical lymphadenopathy. Cardiovascular: Good peripheral circulation. Respiratory: Normal respiratory effort without tachypnea or retractions. Musculoskeletal: No central spinal tenderness to palpation of the thoracic or lumbar region. Mild right paraspinal tenderness palpation but no significant muscle spasms. Decreased flexion of the lumbar spine due to pain. Normal range of motion with lateral flexion and rotation as well as extension. No SI joint tenderness bilaterally. No tenderness to palpation bilateral hips. No lower extremity tenderness nor edema.  No joint effusions. Neurologic:  Normal speech and language. Normal gait and posture. No gross focal neurologic deficits are appreciated.  Skin:  Skin is warm, dry and intact. No rash, redness, swelling, bruising, skin sores noted. Psychiatric: Mood and affect are normal. Speech and behavior are normal. Patient exhibits appropriate insight and judgement.   ____________________________________________   LABS  None ____________________________________________  EKG  None ____________________________________________  RADIOLOGY  None ____________________________________________    PROCEDURES  Procedure(s) performed: None   Procedures   Medications  ketorolac (TORADOL) 30 MG/ML injection 30 mg (30 mg Intramuscular Given 06/06/16 1523)     ____________________________________________   INITIAL IMPRESSION / ASSESSMENT AND PLAN / ED COURSE  Pertinent labs & imaging results that  were available during my care of the patient were reviewed by me and considered in my medical decision making (see chart for details).  Clinical Course     Patient's diagnosis is consistent with Sciatica right side. Patient was given Toradol IM while in the emergency department and tolerated well. Patient will be discharged home with prescriptions for Flexeril and Naprosyn to take as directed. Patient is to follow up with her primary care provider if symptoms persist past this treatment course. Patient is given ED precautions to return to the ED for any worsening or new symptoms.    ____________________________________________  FINAL CLINICAL IMPRESSION(S) / ED DIAGNOSES  Final diagnoses:  Sciatica of right side      NEW MEDICATIONS STARTED DURING THIS VISIT:  New Prescriptions   CYCLOBENZAPRINE (FLEXERIL) 10 MG TABLET    Take 1 tablet (10 mg total) by mouth 3 (three) times daily as needed for muscle spasms.   NAPROXEN (NAPROSYN) 500 MG TABLET    Take 1 tablet (500 mg total) by mouth 2 (two) times daily with a meal.         Jami L Hagler, PA-C 06/06/16  1601    Nita Sicklearolina Veronese, MD 06/09/16 [id number]

## 2016-06-06 NOTE — ED Triage Notes (Signed)
Pt complains of low back pain starting Monday, pt denies any other symptoms

## 2016-08-28 ENCOUNTER — Emergency Department
Admission: EM | Admit: 2016-08-28 | Discharge: 2016-08-28 | Disposition: A | Discharge status: Home / Self Care | Payer: Self-pay | Attending: Emergency Medicine | Admitting: Emergency Medicine

## 2016-08-28 ENCOUNTER — Encounter: Payer: Self-pay | Admitting: Emergency Medicine

## 2016-08-28 DIAGNOSIS — F17210 Nicotine dependence, cigarettes, uncomplicated: Secondary | ICD-10-CM | POA: Insufficient documentation

## 2016-08-28 DIAGNOSIS — I10 Essential (primary) hypertension: Secondary | ICD-10-CM | POA: Insufficient documentation

## 2016-08-28 DIAGNOSIS — N939 Abnormal uterine and vaginal bleeding, unspecified: Secondary | ICD-10-CM | POA: Insufficient documentation

## 2016-08-28 DIAGNOSIS — N390 Urinary tract infection, site not specified: Secondary | ICD-10-CM | POA: Insufficient documentation

## 2016-08-28 LAB — URINALYSIS, COMPLETE (UACMP) WITH MICROSCOPIC
BACTERIA UA: NONE SEEN
SQUAMOUS EPITHELIAL / LPF: NONE SEEN
Specific Gravity, Urine: 1.03 (ref 1.005–1.030)

## 2016-08-28 LAB — COMPREHENSIVE METABOLIC PANEL
ALT: 12 U/L — ABNORMAL LOW (ref 14–54)
ANION GAP: 5 (ref 5–15)
AST: 15 U/L (ref 15–41)
Albumin: 3.9 g/dL (ref 3.5–5.0)
Alkaline Phosphatase: 49 U/L (ref 38–126)
BUN: 15 mg/dL (ref 6–20)
CHLORIDE: 110 mmol/L (ref 101–111)
CO2: 23 mmol/L (ref 22–32)
Calcium: 8.8 mg/dL — ABNORMAL LOW (ref 8.9–10.3)
Creatinine, Ser: 0.79 mg/dL (ref 0.44–1.00)
Glucose, Bld: 97 mg/dL (ref 65–99)
POTASSIUM: 4 mmol/L (ref 3.5–5.1)
Sodium: 138 mmol/L (ref 135–145)
Total Bilirubin: 0.4 mg/dL (ref 0.3–1.2)
Total Protein: 7.4 g/dL (ref 6.5–8.1)

## 2016-08-28 LAB — CBC
HEMATOCRIT: 36.6 % (ref 35.0–47.0)
HEMOGLOBIN: 12 g/dL (ref 12.0–16.0)
MCH: 26.5 pg (ref 26.0–34.0)
MCHC: 32.9 g/dL (ref 32.0–36.0)
MCV: 80.5 fL (ref 80.0–100.0)
Platelets: 212 10*3/uL (ref 150–440)
RBC: 4.55 MIL/uL (ref 3.80–5.20)
RDW: 17.6 % — ABNORMAL HIGH (ref 11.5–14.5)
WBC: 10.2 10*3/uL (ref 3.6–11.0)

## 2016-08-28 LAB — TYPE AND SCREEN
ABO/RH(D): O NEG
ANTIBODY SCREEN: NEGATIVE

## 2016-08-28 LAB — LIPASE, BLOOD: Lipase: 40 U/L (ref 11–51)

## 2016-08-28 MED ORDER — MEDROXYPROGESTERONE ACETATE 10 MG PO TABS: 20.0000 mg | ORAL_TABLET | Freq: Every day | ORAL | 0 refills | Status: DC

## 2016-08-28 MED ORDER — CIPROFLOXACIN HCL 500 MG PO TABS: 500.0000 mg | ORAL_TABLET | Freq: Two times a day (BID) | ORAL | 0 refills | Status: DC

## 2016-08-28 NOTE — ED Triage Notes (Signed)
Pt reports has been on period for three weeks with spotting and light bleeding that returned to spotting. Pt states the past two days the bleeding has become heavy to the point she is saturating her clothes. Pt reports passing softball sized clots.

## 2016-08-28 NOTE — ED Provider Notes (Signed)
[facility] Emergency Department Provider Note  ____________________________________________  Time seen: Approximately 4:08 PM  I have reviewed the triage vital signs and the nursing notes.   HISTORY  Chief Complaint Vaginal Bleeding and Abdominal Pain    HPI Stephanie Kaufman is a 41 y.o. female who complains of heavy vaginal bleeding for the past 2-3 days.  Her last period was about 3 weeks ago where she had light spotting for about 3 days. Then her bleeding stopped, and then over the last few days she's been having heavy bleeding, frequently changing pads, seeing large clots in the toilet. She denies pain. No fever or chills. No chest pain shortness of breath dizziness or lightheadedness or passing out. Denies dysuria frequency urgency. No history of trauma     Past Medical History:  Diagnosis Date  . Depression   . Hypertension   . Migraine      There are no active problems to display for this patient.    Past Surgical History:  Procedure Laterality Date  . CESAREAN SECTION       Prior to Admission medications   Medication Sig Start Date End Date Taking? Authorizing Provider  cyclobenzaprine (FLEXERIL) 10 MG tablet Take 1 tablet (10 mg total) by mouth 3 (three) times daily as needed for muscle spasms. 06/06/16   Jami L Hagler, PA-C  HYDROcodone-acetaminophen (NORCO/VICODIN) 5-325 MG per tablet Take 1 tablet by mouth every 4 (four) hours as needed for moderate pain. 07/24/13   Burgess Amor, PA-C  ketorolac (TORADOL) 10 MG tablet Take 1 tablet (10 mg total) by mouth every 8 (eight) hours. 02/03/15   Jenise V Bacon Menshew, PA-C  medroxyPROGESTERone (PROVERA) 10 MG tablet Take 2 tablets (20 mg total) by mouth daily. 08/28/16   Sharman Cheek, MD  naproxen (NAPROSYN) 500 MG tablet Take 1 tablet (500 mg total) by mouth 2 (two) times daily with a meal. 06/06/16   Jami L Hagler, PA-C  oxyCODONE-acetaminophen (PERCOCET/ROXICET) 5-325 MG per tablet Take 1  tablet by mouth every 6 (six) hours as needed for severe pain. 07/29/13   Charlestine Night, PA-C  penicillin v potassium (VEETID) 500 MG tablet Take 1 tablet (500 mg total) by mouth 4 (four) times daily. 02/03/15   Jenise V Bacon Menshew, PA-C  predniSONE (DELTASONE) 50 MG tablet Take 1 tablet (50 mg total) by mouth daily. 07/29/13   Charlestine Night, PA-C  sertraline (ZOLOFT) 100 MG tablet Take 150 mg by mouth daily.    Historical Provider, MD  topiramate (TOPAMAX) 50 MG tablet Take 50 mg by mouth 2 (two) times daily.    Historical Provider, MD     Allergies Patient has no known allergies.   No family history on file.  Social History Social History  Substance Use Topics  . Smoking status: Current Every Day Smoker    Packs/day: 0.50    Types: Cigarettes  . Smokeless tobacco: Not on file  . Alcohol use Yes     Comment: rare    Review of Systems  Constitutional:   No fever or chills.  ENT:   No sore throat. No rhinorrhea. Cardiovascular:   No chest pain. Respiratory:   No dyspnea or cough. Gastrointestinal:   Negative for abdominal pain, vomiting and diarrhea.  Genitourinary:   Negative for dysuria or difficulty urinating.Positive vaginal bleeding as above. No vaginal discharge. Musculoskeletal:   Negative for focal pain or swelling Neurological:   Negative for headaches 10-point ROS otherwise negative.  ____________________________________________   PHYSICAL  EXAM:  VITAL SIGNS: ED Triage Vitals [08/28/16 1343]  Enc Vitals Group     BP (!) 167/99     Pulse Rate 75     Resp 18     Temp 98.1 F (36.7 C)     Temp Source Oral     SpO2 100 %     Weight 230 lb (104.3 kg)     Height 5\' 7"  (1.702 m)     Head Circumference      Peak Flow      Pain Score 6     Pain Loc      Pain Edu?      Excl. in GC?     Vital signs reviewed, nursing assessments reviewed.   Constitutional:   Alert and oriented. Well appearing and in no distress. Eyes:   No scleral icterus. No  conjunctival pallor. PERRL. EOMI.  No nystagmus. ENT   Head:   Normocephalic and atraumatic.   Nose:   No congestion/rhinnorhea. No septal hematoma   Mouth/Throat:   MMM, no pharyngeal erythema. No peritonsillar mass.    Neck:   No stridor. No SubQ emphysema. No meningismus. Hematological/Lymphatic/Immunilogical:   No cervical lymphadenopathy. Cardiovascular:   RRR. Symmetric bilateral radial and DP pulses.  No murmurs.  Respiratory:   Normal respiratory effort without tachypnea nor retractions. Breath sounds are clear and equal bilaterally. No wheezes/rales/rhonchi. Gastrointestinal:   Soft and nontender. Non distended. There is no CVA tenderness.  No rebound, rigidity, or guarding. Genitourinary:   deferred Musculoskeletal:   Normal range of motion in all extremities. No joint effusions.  No lower extremity tenderness.  No edema. Neurologic:   Normal speech and language.  CN 2-10 normal. Motor grossly intact. No gross focal neurologic deficits are appreciated.  Skin:    Skin is warm, dry and intact. No rash noted.  No petechiae, purpura, or bullae.  ____________________________________________    LABS (pertinent positives/negatives) (all labs ordered are listed, but only abnormal results are displayed) Labs Reviewed  COMPREHENSIVE METABOLIC PANEL - Abnormal; Notable for the following:       Result Value   Calcium 8.8 (*)    ALT 12 (*)    All other components within normal limits  CBC - Abnormal; Notable for the following:    RDW 17.6 (*)    All other components within normal limits  URINALYSIS, COMPLETE (UACMP) WITH MICROSCOPIC - Abnormal; Notable for the following:    Color, Urine RED (*)    APPearance HAZY (*)    Glucose, UA   (*)    Value: TEST NOT REPORTED DUE TO COLOR INTERFERENCE OF URINE PIGMENT   Hgb urine dipstick   (*)    Value: TEST NOT REPORTED DUE TO COLOR INTERFERENCE OF URINE PIGMENT   Bilirubin Urine   (*)    Value: TEST NOT REPORTED DUE TO COLOR  INTERFERENCE OF URINE PIGMENT   Ketones, ur   (*)    Value: TEST NOT REPORTED DUE TO COLOR INTERFERENCE OF URINE PIGMENT   Protein, ur   (*)    Value: TEST NOT REPORTED DUE TO COLOR INTERFERENCE OF URINE PIGMENT   Nitrite   (*)    Value: TEST NOT REPORTED DUE TO COLOR INTERFERENCE OF URINE PIGMENT   Leukocytes, UA   (*)    Value: TEST NOT REPORTED DUE TO COLOR INTERFERENCE OF URINE PIGMENT   All other components within normal limits  URINE CULTURE  LIPASE, BLOOD  TYPE AND SCREEN   ____________________________________________  EKG    ____________________________________________    RADIOLOGY  No results found.  ____________________________________________   PROCEDURES Procedures  ____________________________________________   INITIAL IMPRESSION / ASSESSMENT AND PLAN / ED COURSE  Pertinent labs & imaging results that were available during my care of the patient were reviewed by me and considered in my medical decision making (see chart for details).  Patient well appearing no acute distress. Calm and comfortable. Complains of vaginal bleeding which is irregular and abnormally heavy for the past few days. No pain complaints. Exam is benign. Patient denies menopausal symptoms. With her previous light menstrual cycle, this may be due to over proliferation of the endometrium. We'll do a one-week trial of Provera. Patient counseled on expecting a withdrawal bleed. Patient counseled on following up with gynecology for follow-up to ensure resolution of the bleeding and that further testing may be required if the bleeding persists. Usual return precautions given including for symptoms of anemia. Red blood count is normal today.         ____________________________________________   FINAL CLINICAL IMPRESSION(S) / ED DIAGNOSES  Final diagnoses:  Abnormal vaginal bleeding      New Prescriptions   MEDROXYPROGESTERONE (PROVERA) 10 MG TABLET    Take 2 tablets (20 mg  total) by mouth daily.     Portions of this note were generated with dragon dictation software. Dictation errors may occur despite best attempts at proofreading.    Sharman Cheek, MD 08/28/16 [phone]

## 2016-08-29 LAB — URINE CULTURE: Culture: <10000 — AB

## 2016-09-05 ENCOUNTER — Encounter: Payer: Self-pay | Admitting: *Deleted

## 2016-09-05 ENCOUNTER — Emergency Department: Payer: Self-pay

## 2016-09-05 ENCOUNTER — Emergency Department
Admission: EM | Admit: 2016-09-05 | Discharge: 2016-09-05 | Disposition: A | Discharge status: Home / Self Care | Payer: Self-pay | Attending: Emergency Medicine | Admitting: Emergency Medicine

## 2016-09-05 DIAGNOSIS — I10 Essential (primary) hypertension: Secondary | ICD-10-CM | POA: Insufficient documentation

## 2016-09-05 DIAGNOSIS — Z79899 Other long term (current) drug therapy: Secondary | ICD-10-CM | POA: Insufficient documentation

## 2016-09-05 DIAGNOSIS — N938 Other specified abnormal uterine and vaginal bleeding: Secondary | ICD-10-CM | POA: Insufficient documentation

## 2016-09-05 DIAGNOSIS — F17210 Nicotine dependence, cigarettes, uncomplicated: Secondary | ICD-10-CM | POA: Insufficient documentation

## 2016-09-05 DIAGNOSIS — N922 Excessive menstruation at puberty: Secondary | ICD-10-CM

## 2016-09-05 DIAGNOSIS — R102 Pelvic and perineal pain: Secondary | ICD-10-CM | POA: Insufficient documentation

## 2016-09-05 LAB — COMPREHENSIVE METABOLIC PANEL
ALT: 11 U/L — ABNORMAL LOW (ref 14–54)
ANION GAP: 7 (ref 5–15)
AST: 15 U/L (ref 15–41)
Albumin: 4 g/dL (ref 3.5–5.0)
Alkaline Phosphatase: 46 U/L (ref 38–126)
BUN: 16 mg/dL (ref 6–20)
CHLORIDE: 111 mmol/L (ref 101–111)
CO2: 21 mmol/L — AB (ref 22–32)
Calcium: 9 mg/dL (ref 8.9–10.3)
Creatinine, Ser: 0.79 mg/dL (ref 0.44–1.00)
Glucose, Bld: 95 mg/dL (ref 65–99)
POTASSIUM: 4.2 mmol/L (ref 3.5–5.1)
SODIUM: 139 mmol/L (ref 135–145)
Total Bilirubin: 0.4 mg/dL (ref 0.3–1.2)
Total Protein: 7 g/dL (ref 6.5–8.1)

## 2016-09-05 LAB — URINALYSIS, COMPLETE (UACMP) WITH MICROSCOPIC
BILIRUBIN URINE: NEGATIVE
GLUCOSE, UA: NEGATIVE mg/dL
KETONES UR: NEGATIVE mg/dL
Leukocytes, UA: NEGATIVE
NITRITE: NEGATIVE
PROTEIN: NEGATIVE mg/dL
Specific Gravity, Urine: 1.006 (ref 1.005–1.030)
pH: 5 (ref 5.0–8.0)

## 2016-09-05 LAB — CBC
HEMATOCRIT: 32.3 % — AB (ref 35.0–47.0)
HEMOGLOBIN: 10.9 g/dL — AB (ref 12.0–16.0)
MCH: 27.2 pg (ref 26.0–34.0)
MCHC: 33.8 g/dL (ref 32.0–36.0)
MCV: 80.6 fL (ref 80.0–100.0)
Platelets: 233 10*3/uL (ref 150–440)
RBC: 4.01 MIL/uL (ref 3.80–5.20)
RDW: 17.1 % — ABNORMAL HIGH (ref 11.5–14.5)
WBC: 8.6 10*3/uL (ref 3.6–11.0)

## 2016-09-05 LAB — HCG, QUANTITATIVE, PREGNANCY

## 2016-09-05 MED ORDER — PROCHLORPERAZINE EDISYLATE 5 MG/ML IJ SOLN
10.0000 mg | Freq: Once | INTRAMUSCULAR | Status: AC
  Administered 2016-09-05: 10 mg via INTRAVENOUS
  Filled 2016-09-05: qty 2

## 2016-09-05 MED ORDER — KETOROLAC TROMETHAMINE 30 MG/ML IJ SOLN
15.0000 mg | Freq: Once | INTRAMUSCULAR | Status: AC
  Administered 2016-09-05: 15 mg via INTRAVENOUS
  Filled 2016-09-05: qty 1

## 2016-09-05 MED ORDER — MEDROXYPROGESTERONE ACETATE 10 MG PO TABS: ORAL_TABLET | ORAL | 0 refills | Status: DC

## 2016-09-05 MED ORDER — SODIUM CHLORIDE 0.9 % IV BOLUS (SEPSIS)
1000.0000 mL | Freq: Once | INTRAVENOUS | Status: AC
  Administered 2016-09-05: 1000 mL via INTRAVENOUS

## 2016-09-05 NOTE — ED Notes (Signed)
Patient states she has gone through 3 boxes of tampons in the past two weeks.

## 2016-09-05 NOTE — ED Provider Notes (Signed)
[facility] Emergency Department Provider Note  ____________________________________________   I have reviewed the triage vital signs and the nursing notes.   HISTORY  Chief Complaint Vaginal Bleeding and Abdominal Pain    HPI Stephanie Kaufman is a 41 y.o. female with a history of migraines and very heavy vaginal bleeding in the past, who states that she is having 2 complaints today. The first is a normal migraine, this was something that began today gradually, not worsening of life consistent with poor multiple migraines. Patient also has another problem which is her vaginal bleeding. This is actually the reason she is here. She states the migraine would not open up to bring her in. She takes Topamax every day for the migraines. Patient states that she has had heavy vaginal bleeding for over a week now. She was given a Provera taper and that seems to make it somewhat B she still states she is going through several pads a day. She denies feeling lightheaded. Hemoglobin is 10.9 down from 12 a week ago. Patient states that she tried to make an appointment with OB/GYN but they were unable to get her in for a few more weeks. She denies any significant pain she states she has some cramping discomfort which is normal for her for her period. She states in the past, which she has had heavy periods like this, she has been given Provera and it goes away but this time it did not help. She states she was compliant with the Provera which was 20 mg a day for one week. She is out of them now.   He is not on any blood thinners and denies any bleeding dyscrasias in herself or her family  Past Medical History:  Diagnosis Date  . Depression   . Hypertension   . Migraine     There are no active problems to display for this patient.   Past Surgical History:  Procedure Laterality Date  . CESAREAN SECTION      Prior to Admission medications   Medication Sig Start Date End Date  Taking? Authorizing Provider  ciprofloxacin (CIPRO) 500 MG tablet Take 1 tablet (500 mg total) by mouth 2 (two) times daily. 08/28/16   Sharman Cheek, MD  cyclobenzaprine (FLEXERIL) 10 MG tablet Take 1 tablet (10 mg total) by mouth 3 (three) times daily as needed for muscle spasms. 06/06/16   Jami L Hagler, PA-C  HYDROcodone-acetaminophen (NORCO/VICODIN) 5-325 MG per tablet Take 1 tablet by mouth every 4 (four) hours as needed for moderate pain. 07/24/13   Burgess Amor, PA-C  ketorolac (TORADOL) 10 MG tablet Take 1 tablet (10 mg total) by mouth every 8 (eight) hours. 02/03/15   Jenise V Bacon Menshew, PA-C  medroxyPROGESTERone (PROVERA) 10 MG tablet Take 2 tablets (20 mg total) by mouth daily. 08/28/16   Sharman Cheek, MD  naproxen (NAPROSYN) 500 MG tablet Take 1 tablet (500 mg total) by mouth 2 (two) times daily with a meal. 06/06/16   Jami L Hagler, PA-C  oxyCODONE-acetaminophen (PERCOCET/ROXICET) 5-325 MG per tablet Take 1 tablet by mouth every 6 (six) hours as needed for severe pain. 07/29/13   Charlestine Night, PA-C  penicillin v potassium (VEETID) 500 MG tablet Take 1 tablet (500 mg total) by mouth 4 (four) times daily. 02/03/15   Jenise V Bacon Menshew, PA-C  predniSONE (DELTASONE) 50 MG tablet Take 1 tablet (50 mg total) by mouth daily. 07/29/13   Charlestine Night, PA-C  sertraline (ZOLOFT) 100 MG tablet Take  150 mg by mouth daily.    Historical Provider, MD  topiramate (TOPAMAX) 50 MG tablet Take 50 mg by mouth 2 (two) times daily.    Historical Provider, MD    Allergies Patient has no known allergies.  No family history on file.  Social History Social History  Substance Use Topics  . Smoking status: Current Every Day Smoker    Packs/day: 0.50    Types: Cigarettes  . Smokeless tobacco: Not on file  . Alcohol use Yes     Comment: rare    Review of Systems Constitutional: No fever/chills Eyes: No visual changes. ENT: No sore throat. No stiff neck no neck pain Cardiovascular:  Denies chest pain. Respiratory: Denies shortness of breath. Gastrointestinal:   no vomiting.  No diarrhea.  No constipation. Genitourinary: Negative for dysuria. Musculoskeletal: Negative lower extremity swelling Skin: Negative for rash. Neurological: Negative for severeAtypical headaches, focal weakness or numbness. 10-point ROS otherwise negative.  ____________________________________________   PHYSICAL EXAM:  VITAL SIGNS: ED Triage Vitals  Enc Vitals Group     BP 09/05/16 1452 127/76     Pulse Rate 09/05/16 1452 71     Resp 09/05/16 1452 20     Temp 09/05/16 1452 99 F (37.2 C)     Temp Source 09/05/16 1452 Oral     SpO2 09/05/16 1452 100 %     Weight 09/05/16 1453 226 lb (102.5 kg)     Height 09/05/16 1453 5\' 7"  (1.702 m)     Head Circumference --      Peak Flow --      Pain Score 09/05/16 1506 6     Pain Loc --      Pain Edu? --      Excl. in GC? --     Constitutional: Alert and oriented. Well appearing and in no acute distress. Eyes: Conjunctivae are normal. PERRL. EOMI. Head: Atraumatic. Nose: No congestion/rhinnorhea. Mouth/Throat: Mucous membranes are moist.  Oropharynx non-erythematous. Neck: No stridor.   Nontender with no meningismus Cardiovascular: Normal rate, regular rhythm. Grossly normal heart sounds.  Good peripheral circulation. Respiratory: Normal respiratory effort.  No retractions. Lungs CTAB. Abdominal: Soft and nontender. No distention. No guarding no rebound Back:  There is no focal tenderness or step off.  there is no midline tenderness there are no lesions noted. there is no CVA tenderness GU: Patient declines pending ultrasound. She would rather have OB do it in the office when she goes. She understands this limits my ability to work her up Musculoskeletal: No lower extremity tenderness, no upper extremity tenderness. No joint effusions, no DVT signs strong distal pulses no edema Neurologic:  Normal speech and language. No gross focal  neurologic deficits are appreciated.  Skin:  Skin is warm, dry and intact. No rash noted. Psychiatric: Mood and affect are normal. Speech and behavior are normal.  ____________________________________________   LABS (all labs ordered are listed, but only abnormal results are displayed)  Labs Reviewed  CBC - Abnormal; Notable for the following:       Result Value   Hemoglobin 10.9 (*)    HCT 32.3 (*)    RDW 17.1 (*)    All other components within normal limits  URINALYSIS, COMPLETE (UACMP) WITH MICROSCOPIC - Abnormal; Notable for the following:    Color, Urine YELLOW (*)    APPearance CLEAR (*)    Hgb urine dipstick LARGE (*)    Bacteria, UA RARE (*)    Squamous Epithelial / LPF 0-5 (*)  All other components within normal limits  COMPREHENSIVE METABOLIC PANEL - Abnormal; Notable for the following:    CO2 21 (*)    ALT 11 (*)    All other components within normal limits  HCG, QUANTITATIVE, PREGNANCY   ____________________________________________  EKG  I personally interpreted any EKGs ordered by me or triage  ____________________________________________  RADIOLOGY  I reviewed any imaging ordered by me or triage that were performed during my shift and, if possible, patient and/or family made aware of any abnormal findings. ____________________________________________   PROCEDURES  Procedure(s) performed: None  Procedures  Critical Care performed: None  ____________________________________________   INITIAL IMPRESSION / ASSESSMENT AND PLAN / ED COURSE  Pertinent labs & imaging results that were available during my care of the patient were reviewed by me and considered in my medical decision making (see chart for details).  Patient with ongoing vaginal bleeding, discussed with Dr. Jean RosenthalJackson who suggested Provera 20 mg 3 times a day 1 week and then 10 mg a day until they can get her in. He did state that although an ultrasound here would not change anything I do,  that might help the patient had a more efficient visit with them and we will obtain that as a courtesy to the patient although it is not emergently indicated. Patient declined pelvic exam, and she understands limitations are placed upon my workup. Risk benefits alternatives to pelvic exam understood and patient declines to have it. Patient is not pregnant. Patient has no evidence of semen endemic instability. She has no evidence of significant drop in hemoglobin despite reported bleeding. OB feels she is safe for discharge from this point of view. Patient also states that she got a headache essentially as she was coming in to see us. Gradual headache that worse than life. She states stress makes these headaches worsen she's been under stress because her vaginal bleeding. We are giving her a migraine cocktail. This time her headache is nearly gone. I do not think this her presents meningitis, bleed, cavernous thrombosis, mass or other acute intracranial pathology. She has a neurologic exam which is quite reassuring. If ultrasound is negative, though it is my hope we can get her home safely.    ____________________________________________   FINAL CLINICAL IMPRESSION(S) / ED DIAGNOSES  Final diagnoses:  Pelvic pain      This chart was dictated using voice recognition software.  Despite best efforts to proofread,  errors can occur which can change meaning.      Jeanmarie PlantJames A McShane, MD 09/05/16 [phone]

## 2016-09-05 NOTE — ED Triage Notes (Signed)
Pt was seen 1 week ago for vaginal bleeding and UTI, pt reports increased abdominal pain with light vaginal bleeding and nausea

## 2016-09-10 ENCOUNTER — Telehealth: Payer: Self-pay | Admitting: Obstetrics and Gynecology

## 2016-09-10 NOTE — Telephone Encounter (Signed)
Msg left for patient to cb and schedule ER follow up.  [facility] did have a few openings on Thursday morning, 3/15.

## 2016-10-02 ENCOUNTER — Encounter: Payer: Self-pay | Admitting: Obstetrics and Gynecology

## 2016-10-02 ENCOUNTER — Ambulatory Visit (INDEPENDENT_AMBULATORY_CARE_PROVIDER_SITE_OTHER): Payer: Managed Care, Other (non HMO) | Admitting: Obstetrics and Gynecology

## 2016-10-02 VITALS — BP 122/74 | Ht 67.0 in | Wt 230.0 lb

## 2016-10-02 DIAGNOSIS — N921 Excessive and frequent menstruation with irregular cycle: Secondary | ICD-10-CM | POA: Diagnosis not present

## 2016-10-02 DIAGNOSIS — N946 Dysmenorrhea, unspecified: Secondary | ICD-10-CM

## 2016-10-02 NOTE — Progress Notes (Addendum)
Obstetrics & Gynecology Office Visit   Chief Complaint  Patient presents with  . Menstrual Problem   History of Present Illness:  Heavy bleeding onset 2 months ago. Passing large clots, soaking through clothes and pads.  To ER in February for course of provera with mild improvement.  March 8 to ER for return of bleeding, Provera 20 mg tid which stopped bleeding. U/S in ER (transabdominal only) showed no obvious problems, but view was suboptimal.   Prior to February, menses came monthly, lasting 5-7 days and were moderate flow without clots and had mild dysmenorrhea.  Now severe dysmenorrhea. Denies other symptoms. Denies fevers, chills, nausea, vomiting, early satiety, bloating, weight changes. Her pain is generally associated only with her bleeding.  Review of Systems: Review of Systems  Constitutional: Negative.   HENT: Negative.   Eyes: Negative.   Respiratory: Negative.   Cardiovascular: Negative.   Gastrointestinal: Positive for abdominal pain. Negative for blood in stool, constipation, diarrhea, heartburn, melena, nausea and vomiting.  Genitourinary: Negative for dysuria, flank pain, frequency, hematuria and urgency.       See HPI for gynecologic-specific ROS  Musculoskeletal: Negative.   Skin: Negative.   Neurological: Negative.   Psychiatric/Behavioral: Negative.     Past Medical History:  Diagnosis Date  . Depression   . Hypertension   . Migraine     Past Surgical History:  Procedure Laterality Date  . CESAREAN SECTION      Gynecologic History: Patient's last menstrual period was 09/05/2016.  Obstetric History: [id number], s/p C-section x 2.   Family History: Denies history of gynecologic cancer History reviewed. No pertinent family history.   Social History   Social History  . Marital status: Single    Spouse name: N/A  . Number of children: N/A  . Years of education: N/A   Occupational History  . Not on file.   Social History Main Topics  .  Smoking status: Current Every Day Smoker    Packs/day: 0.50    Types: Cigarettes  . Smokeless tobacco: Never Used  . Alcohol use Yes     Comment: rare  . Drug use: No  . Sexual activity: Not on file   Other Topics Concern  . Not on file   Social History Narrative  . No narrative on file    Allergies: No Known Allergies  Medications:   Medication Sig Start Date End Date Taking? Authorizing Provider  lisinopril (PRINIVIL,ZESTRIL) 2.5 MG tablet Take 2.5 mg by mouth daily.   Yes Historical Provider, MD  topiramate (TOPAMAX) 50 MG tablet Take 50 mg by mouth 2 (two) times daily.   Yes Historical Provider, MD    Physical Exam BP 122/74   Ht  (1.702 m)   Wt 230 lb (104.3 kg)   LMP 09/05/2016   BMI 36.02 kg/m   Patient's last menstrual period was 09/05/2016.  Physical Exam  Constitutional: She is oriented to person, place, and time. She appears well-developed and well-nourished. No distress.  Genitourinary: Vagina normal. Pelvic exam was performed with patient supine. There is no rash, tenderness, lesion or Bartholin's cyst on the right labia. There is no rash, tenderness, lesion or Bartholin's cyst on the left labia. Vagina exhibits rugosity. Vagina exhibits no lesion. No erythema or bleeding in the vagina. No vaginal discharge found.  Right adnexum displays tenderness (mild ttp ). Right adnexum does not display mass and does not display fullness. Left adnexum does not display mass, does not display tenderness and does  not display fullness.  Cervix exhibits pinkness. Cervix does not exhibit motion tenderness, lesion, discharge, friability, polyp or nabothian cyst.   Uterus is tender, mobile and anteverted. Uterus is not enlarged or exhibiting a mass.  HENT:  Head: Normocephalic and atraumatic.  Eyes: Conjunctivae are normal. No scleral icterus.  Neck: Normal range of motion. Neck supple. No tracheal deviation present. No thyromegaly present.  Cardiovascular: Normal rate and  regular rhythm.  Exam reveals no friction rub.   No murmur heard. Pulmonary/Chest: Effort normal and breath sounds normal. No respiratory distress. She has no wheezes. She has no rales.  Abdominal: Soft. Bowel sounds are normal. She exhibits no distension and no mass. There is no tenderness. There is no rebound and no guarding. No hernia. Hernia confirmed negative in the right inguinal area and confirmed negative in the left inguinal area.  Musculoskeletal: Normal range of motion.  Lymphadenopathy:    She has no cervical adenopathy.  Neurological: She is alert and oriented to person, place, and time. No cranial nerve deficit.  Skin: Skin is warm and dry. No rash noted.  Psychiatric: She has a normal mood and affect. Her behavior is normal. Judgment normal.   Female chaperone present for pelvic and breast  portions of the physical exam  Procedure - endometrial biopsy Endometrial Biopsy After discussion with the patient regarding her abnormal uterine bleeding I recommended that she proceed with an endometrial biopsy for further diagnosis. The risks, benefits, alternatives, and indications for an endometrial biopsy were discussed with the patient in detail. She understood the risks including infection, bleeding, cervical laceration and uterine perforation.  Verbal consent was obtained.   PROCEDURE NOTE:  Pipelle endometrial biopsy was performed using aseptic technique with iodine preparation.  The uterus was sounded to a length of 9.5 cm.  Adequate sampling was obtained with minimal blood loss.  The patient tolerated the procedure well.  Disposition will be pending pathology.  Assessment: 41 y.o. [id number] with menorrhagia and dysmenorrhea.    Plan: Return in about 1 week (around 10/09/2016) for schedule pelvic u/s and follow up Dr Jean Rosenthal after.   Endometrial biopsy today.   Will follow up based on these results. Will need to get pap smear at follow up visit.   Thomasene Mohair, MD 10/16/2016  12:03 PM

## 2016-10-03 NOTE — Telephone Encounter (Signed)
Called and left Voicemail to call back and r/s .2x

## 2016-10-03 NOTE — Telephone Encounter (Signed)
-----   Message from Felicia H GeorVal Eagle5/2018  9:36 AM EDT ----- Regarding: Please reschedule Please reschedule her u/s and rob for 10/23/16 to sooner in the day or another day.  Belenda Cruise will be off all day and Abeer will be off in the pm.  Thank you, Sunny Schlein

## 2016-10-10 LAB — PATHOLOGY

## 2016-10-16 ENCOUNTER — Telehealth: Payer: Self-pay | Admitting: Obstetrics and Gynecology

## 2016-10-16 NOTE — Telephone Encounter (Signed)
Pt calling back, [facility] will call her back from hospital

## 2016-10-16 NOTE — Telephone Encounter (Signed)
Discussed biopsy results.  Will proceed with u/s and pap on 4/26. Discussed that if all continues to be otherwise normal, will recommend polyp removal via hysteroscopy.

## 2016-10-23 ENCOUNTER — Other Ambulatory Visit: Payer: Managed Care, Other (non HMO)

## 2016-10-23 ENCOUNTER — Ambulatory Visit: Payer: Managed Care, Other (non HMO) | Admitting: Obstetrics and Gynecology

## 2016-10-24 ENCOUNTER — Encounter: Payer: Self-pay | Admitting: Obstetrics and Gynecology

## 2016-10-24 ENCOUNTER — Ambulatory Visit (INDEPENDENT_AMBULATORY_CARE_PROVIDER_SITE_OTHER): Payer: Managed Care, Other (non HMO) | Admitting: Obstetrics and Gynecology

## 2016-10-24 ENCOUNTER — Ambulatory Visit (INDEPENDENT_AMBULATORY_CARE_PROVIDER_SITE_OTHER): Payer: Managed Care, Other (non HMO)

## 2016-10-24 VITALS — BP 124/74 | Ht 67.0 in | Wt 232.0 lb

## 2016-10-24 DIAGNOSIS — N840 Polyp of corpus uteri: Secondary | ICD-10-CM

## 2016-10-24 DIAGNOSIS — N946 Dysmenorrhea, unspecified: Secondary | ICD-10-CM

## 2016-10-24 DIAGNOSIS — N921 Excessive and frequent menstruation with irregular cycle: Secondary | ICD-10-CM

## 2016-10-24 NOTE — Progress Notes (Signed)
   Gynecology Ultrasound Follow Up  Chief Complaint:  Chief Complaint  Patient presents with  . Follow-up    U/S Follow Up  Abnormal uterine bleeding and painful periods   History of Present Illness: Patient is a 41 y.o. female who presents today for ultrasound evaluation of the above .  Ultrasound demonstrates the following findings Adnexa: not visualized on left (but was normal on 09/05/16 ultrasound), right ovary appeared normal Uterus: with endometrial stripe  5.1 mm Additional: small lower uterine segment endometrial polyp (~0.7 x 0.3 mm).  Two large Nabothian cysts (largest 2.3 cm in largest dimension).   She states her symptoms are unchanged.   Review of Systems: Review of Systems  Constitutional: Negative.   HENT: Negative.   Eyes: Negative.   Respiratory: Negative.   Cardiovascular: Negative.   Gastrointestinal: Positive for abdominal pain. Negative for blood in stool, constipation, diarrhea, heartburn, melena, nausea and vomiting.  Genitourinary: Negative.   Musculoskeletal: Negative.   Skin: Negative.   Neurological: Negative.   Psychiatric/Behavioral: Negative.      Past Medical History:  Diagnosis Date  . Depression   . Hypertension   . Migraine     Past Surgical History:  Procedure Laterality Date  . CESAREAN SECTION      Family History: no gynecologic cancer noted  Social History   Social History  . Marital status: Single    Spouse name: N/A  . Number of children: N/A  . Years of education: N/A   Occupational History  . Not on file.   Social History Main Topics  . Smoking status: Current Every Day Smoker    Packs/day: 0.50    Types: Cigarettes  . Smokeless tobacco: Never Used  . Alcohol use Yes     Comment: rare  . Drug use: No  . Sexual activity: Not on file   Other Topics Concern  . Not on file   Social History Narrative  . No narrative on file    Allergies: No Known Allergies  Medications:   Medication Sig Start Date End  Date Taking? Authorizing Provider  lisinopril (PRINIVIL,ZESTRIL) 2.5 MG tablet Take 2.5 mg by mouth daily.    Historical Provider, MD  sertraline (ZOLOFT) 100 MG tablet Take 150 mg by mouth daily.    Historical Provider, MD  topiramate (TOPAMAX) 50 MG tablet Take 50 mg by mouth 2 (two) times daily.    Historical Provider, MD    Physical Exam Vital signs: BP 124/74   Ht  (1.702 m)   Wt 232 lb (105.2 kg)   BMI 36.34 kg/m   General: NAD HEENT: normocephalic, anicteric Pulmonary: No increased work of breathing CV: RRR Extremities: no edema, erythema, or tenderness Neurologic: Grossly intact, normal gait Psychiatric: mood appropriate, affect full  Assessment: 41 y.o. [id number] with abnormal uterine bleeding due to polyp (noted on biopsy, aka menorrhagia with irregular cycle), and dysmenorrhea.  Plan: Discussed all findings. Most likely cause is endometrial polyp.  She has had an otherwise normal endometrial biopsy apart from the polyp, which is supported by pelvic ultrasound.  Pap smear today.    Discussed treatment. Mutual agreement made to take patient to OR for hysteroscopy, dilation and curettage, with polypectomy.    15 minutes spent in face to face discussion with > 50% spent in counseling and management of her menorrhagia and dysmenorrhea.   Thomasene Mohair, MD 10/25/2016 11:20 AM

## 2016-10-25 ENCOUNTER — Telehealth: Payer: Self-pay | Admitting: Obstetrics and Gynecology

## 2016-10-25 DIAGNOSIS — N840 Polyp of corpus uteri: Secondary | ICD-10-CM | POA: Insufficient documentation

## 2016-10-25 NOTE — Telephone Encounter (Signed)
-----   Message from Conard Novak, MD sent at 10/25/2016 11:22 AM EDT ----- Regarding: Schedule Surgery Surgery Booking Request Patient Full Name: Stephanie Kaufman  MRN: [id number]   DOB: [birth year]  Surgeon: Thomasene Mohair, MD  Requested Surgery Date and Time: 11/14/2016 Primary Diagnosis and Code:  1)  menorrhagia with irregular cycle (N92.1) 2)  Dysmenorrhea (N94.6) 3) endometrial polyp (N84.0) Secondary Diagnosis and Code:  Surgical Procedure: hysteroscopy, dilation and curettage, polypectomy L&D Notification: No Admission Status: same day surgery Length of Surgery: 40 minutes Special Case Needs: MyoSure H&P: TBD (date) Phone Interview or Office Pre-Admit: TBD Interpreter: n/a Language: English Medical Clearance: n/a Special Scheduling Instructions: none

## 2016-10-25 NOTE — Telephone Encounter (Signed)
Lmtrc

## 2016-10-28 NOTE — Telephone Encounter (Signed)
Lmtrc

## 2016-10-28 NOTE — Telephone Encounter (Signed)
Patient is aware of H&P on 11/11/16 @ 1:30pm, Pre-admit Testing phone interview afterwards, and OR on 11/14/16. Patient has my phone# and ext.

## 2016-10-28 NOTE — Telephone Encounter (Signed)
-----   Message from Stephen D Jackson, MD sent at 10/25/2016 11:22 AM EDT ----- °Regarding: Schedule Surgery °Surgery Booking Request °Patient Full Name: Stephanie Kaufman  °MRN: [id number]   °DOB: 01/13/1976  °Surgeon: Stephen Jackson, MD  °Requested Surgery Date and Time: 11/14/2016 °Primary Diagnosis and Code:  °1)  menorrhagia with irregular cycle (N92.1) °2)  Dysmenorrhea (N94.6) °3) endometrial polyp (N84.0) °Secondary Diagnosis and Code:  °Surgical Procedure: hysteroscopy, dilation and curettage, polypectomy °L&D Notification: No °Admission Status: same day surgery °Length of Surgery: 40 minutes °Special Case Needs: MyoSure °H&P: TBD (date) °Phone Interview or Office Pre-Admit: TBD °Interpreter: n/a °Language: English °Medical Clearance: n/a °Special Scheduling Instructions: none  °

## 2016-10-30 LAB — IGP,CTNG,APTIMAHPV,RFX16/18,45
Chlamydia, Nuc. Acid Amp: NEGATIVE
GONOCOCCUS BY NUCLEIC ACID AMP: NEGATIVE
HPV APTIMA: NEGATIVE
PAP Smear Comment: 0

## 2016-11-11 ENCOUNTER — Ambulatory Visit (INDEPENDENT_AMBULATORY_CARE_PROVIDER_SITE_OTHER): Payer: Managed Care, Other (non HMO) | Admitting: Obstetrics and Gynecology

## 2016-11-11 ENCOUNTER — Encounter: Payer: Self-pay | Admitting: Obstetrics and Gynecology

## 2016-11-11 ENCOUNTER — Encounter
Admission: RE | Admit: 2016-11-11 | Discharge: 2016-11-11 | Disposition: A | Discharge status: Home / Self Care | Payer: Managed Care, Other (non HMO) | Source: Ambulatory Visit | Attending: Obstetrics and Gynecology | Admitting: Obstetrics and Gynecology

## 2016-11-11 VITALS — BP 128/74 | Ht 67.0 in | Wt 232.0 lb

## 2016-11-11 DIAGNOSIS — N840 Polyp of corpus uteri: Secondary | ICD-10-CM

## 2016-11-11 DIAGNOSIS — N946 Dysmenorrhea, unspecified: Secondary | ICD-10-CM

## 2016-11-11 HISTORY — DX: Personal history of urinary calculi: Z87.442

## 2016-11-11 NOTE — Patient Instructions (Signed)
  Your procedure is scheduled on: 11-14-16 THURSDAY Report to Same Day Surgery 2nd floor medical mall [facility](Medical Mall Entrance-take elevator on left to 2nd floor.  Check in with surgery information desk.) To find out your arrival time please call [phone] between 1PM - 3PM [facility]  Remember: Instructions that are not followed completely may result in serious medical risk, up to and including death, or upon the discretion of your surgeon and anesthesiologist your surgery may need to be rescheduled.    _x___ 1. Do not eat food or drink liquids after midnight. No gum chewing or hard candies.     __x__ 2. No Alcohol for 24 hours before or after surgery.   __x__3. No Smoking for 24 prior to surgery.   ____  4. Bring all medications with you on the day of surgery if instructed.    __x__ 5. Notify your doctor if there is any change in your medical condition     (cold, fever, infections).     Do not wear jewelry, make-up, hairpins, clips or nail polish.  Do not wear lotions, powders, or perfumes. You may wear deodorant.  Do not shave 48 hours prior to surgery. Men may shave face and neck.  Do not bring valuables to the hospital.    [facility] any belongings or valuables.               Contacts, dentures or bridgework may not be worn into surgery.  Leave your suitcase in the car. After surgery it may be brought to your room.  For patients admitted to the hospital, discharge time is determined by your treatment team.   Patients discharged the day of surgery will not be allowed to drive home.  You will need someone to drive you home and stay with you the night of your procedure.    Please read over the following fact sheets that you were given:    _x___ TAKE THE FOLLOWING MEDICATION THE MORNING OF SURGERY WITH A SMALL SIP OF WATER. These include:  1. TOPAMAX  2.  3.  4.  5.  6.  ____Fleets enema or Magnesium Citrate as directed.   ____ Use CHG  Soap or sage wipes as directed on instruction sheet   ____ Use inhalers on the day of surgery and bring to hospital day of surgery  ____ Stop Metformin and Janumet 2 days prior to surgery.    ____ Take 1/2 of usual insulin dose the night before surgery and none on the morning  surgery.   ____ Follow recommendations from Cardiologist, Pulmonologist or PCP regarding stopping Aspirin, Coumadin, Pllavix ,Eliquis, Effient, or Pradaxa, and Pletal.  X____Stop Anti-inflammatories such as ADVIL, Aleve, Ibuprofen, Motrin, NAPROXEN, Naprosyn, Goodies powders or aspirin products NOW-OK to take Tylenol    ____ Stop supplements until after surgery.     ____ Bring C-Pap to the hospital.

## 2016-11-11 NOTE — Progress Notes (Signed)
Preoperative History and Physical  Stephanie Kaufman is a 41 y.o. [id number] here for surgical management of abnormal uterine bleeding (menorrhagia) and painful periods.   No significant preoperative concerns.  History of Present Illness: 41 y.o. [id number] female who presented to ER in 08/2016 after heavy 2 months of passage of large clots, soaking through clothes and pads.  She has tried provera with mild improvement. She tried a higher dose of provera which stopped bleeding.  Prior to this she had normal menses. Pelvic ultrasound showed: Adnexa: not visualized on left (but was normal on 09/05/16 ultrasound), right ovary appeared normal Uterus: with endometrial stripe  5.1 mm Additional: small lower uterine segment endometrial polyp (~0.7 x 0.3 mm).  Two large Nabothian cysts (largest 2.3 cm in largest dimension).   Proposed surgery: hysteroscopy, dilation and curettage, and polypectomy  Past Medical History:  Diagnosis Date  . Depression   . Hypertension   . Migraine    Past Surgical History:  Procedure Laterality Date  . CESAREAN SECTION     OB History  Gravida Para Term Preterm AB Living  2 2 2     2   SAB TAB Ectopic Multiple Live Births          2    # Outcome Date GA Lbr Len/2nd Weight Sex Delivery Anes PTL Lv  2 Term           1 Term             Patient denies any other pertinent gynecologic issues.   Current Outpatient Prescriptions on File Prior to Visit  Medication Sig Dispense Refill  . lisinopril (PRINIVIL,ZESTRIL) 2.5 MG tablet Take 2.5 mg by mouth daily.    Marland Kitchen. topiramate (TOPAMAX) 50 MG tablet Take 50 mg by mouth 2 (two) times daily.     No current facility-administered medications on file prior to visit.    No Known Allergies  Social History:   reports that she has been smoking Cigarettes.  She has been smoking about 0.50 packs per day. She has never used smokeless tobacco. She reports that she drinks alcohol. She reports that she does not use drugs.  History  reviewed. No pertinent family history.  Review of Systems: Noncontributory  PHYSICAL EXAM: Blood pressure 128/74, height 5\' 7"  (1.702 m), weight 232 lb (105.2 kg). CONSTITUTIONAL: Well-developed, well-nourished female in no acute distress.  HENT:  Normocephalic, atraumatic, External right and left ear normal. Oropharynx is clear and moist EYES: Conjunctivae and EOM are normal. Pupils are equal, round, and reactive to light. No scleral icterus.  NECK: Normal range of motion, supple, no masses SKIN: Skin is warm and dry. No rash noted. Not diaphoretic. No erythema. No pallor. NEUROLGIC: Alert and oriented to person, place, and time. Normal reflexes, muscle tone coordination. No cranial nerve deficit noted. PSYCHIATRIC: Normal mood and affect. Normal behavior. Normal judgment and thought content. CARDIOVASCULAR: Normal heart rate noted, regular rhythm RESPIRATORY: Effort and breath sounds normal, no problems with respiration noted ABDOMEN: Soft, nontender, nondistended. PELVIC: Deferred MUSCULOSKELETAL: Normal range of motion. No edema and no tenderness. 2+ distal pulses.   Assessment: Patient Active Problem List   Diagnosis Date Noted  . Endometrial polyp 10/25/2016  . Menorrhagia with irregular cycle 10/02/2016  . Dysmenorrhea 10/02/2016    Plan: Patient will undergo surgical management with hysteroscopy, dilation and curettage, and polypectomy.   The risks of surgery were discussed in detail with the patient including but not limited to: bleeding which may require  transfusion or reoperation; infection which may require antibiotics; injury to surrounding organs which may involve bowel, bladder, ureters ; need for additional procedures including laparoscopy or laparotomy; thromboembolic phenomenon, surgical site problems and other postoperative/anesthesia complications. Likelihood of success in alleviating the patient's condition was discussed. Routine postoperative instructions will be  reviewed with the patient and her family in detail after surgery.  The patient concurred with the proposed plan, giving informed written consent for the surgery.  Preoperative prophylactic antibiotics (if indicated) and SCDs ordered on call to the OR.    Thomasene Mohair, MD 11/11/2016 1:49 PM

## 2016-11-12 ENCOUNTER — Encounter
Admission: RE | Admit: 2016-11-12 | Discharge: 2016-11-12 | Disposition: A | Discharge status: Home / Self Care | Payer: Managed Care, Other (non HMO) | Source: Ambulatory Visit | Attending: Obstetrics and Gynecology | Admitting: Obstetrics and Gynecology

## 2016-11-12 DIAGNOSIS — I10 Essential (primary) hypertension: Secondary | ICD-10-CM | POA: Diagnosis not present

## 2016-11-12 DIAGNOSIS — N946 Dysmenorrhea, unspecified: Secondary | ICD-10-CM | POA: Diagnosis not present

## 2016-11-12 DIAGNOSIS — F17210 Nicotine dependence, cigarettes, uncomplicated: Secondary | ICD-10-CM | POA: Diagnosis not present

## 2016-11-12 DIAGNOSIS — N840 Polyp of corpus uteri: Secondary | ICD-10-CM | POA: Diagnosis not present

## 2016-11-12 DIAGNOSIS — F329 Major depressive disorder, single episode, unspecified: Secondary | ICD-10-CM | POA: Diagnosis not present

## 2016-11-12 DIAGNOSIS — N920 Excessive and frequent menstruation with regular cycle: Secondary | ICD-10-CM | POA: Diagnosis not present

## 2016-11-13 ENCOUNTER — Encounter: Payer: Self-pay | Admitting: Obstetrics and Gynecology

## 2016-11-13 NOTE — OR Nursing (Signed)
Per blood bank 5/16 - pt will need a T&S; order entered under signed and held, Kenna GilbertS [employer], RN

## 2016-11-14 ENCOUNTER — Encounter
Admission: RE | Disposition: A | Discharge status: Home / Self Care | Payer: Self-pay | Source: Ambulatory Visit | Attending: Obstetrics and Gynecology

## 2016-11-14 ENCOUNTER — Ambulatory Visit
Admission: RE | Admit: 2016-11-14 | Discharge: 2016-11-14 | Disposition: A | Discharge status: Home / Self Care | Payer: Managed Care, Other (non HMO) | Source: Ambulatory Visit | Attending: Obstetrics and Gynecology | Admitting: Obstetrics and Gynecology

## 2016-11-14 ENCOUNTER — Ambulatory Visit: Payer: Managed Care, Other (non HMO) | Admitting: Anesthesiology

## 2016-11-14 ENCOUNTER — Encounter: Payer: Self-pay | Admitting: *Deleted

## 2016-11-14 DIAGNOSIS — I10 Essential (primary) hypertension: Secondary | ICD-10-CM | POA: Insufficient documentation

## 2016-11-14 DIAGNOSIS — N920 Excessive and frequent menstruation with regular cycle: Secondary | ICD-10-CM | POA: Insufficient documentation

## 2016-11-14 DIAGNOSIS — F17210 Nicotine dependence, cigarettes, uncomplicated: Secondary | ICD-10-CM | POA: Insufficient documentation

## 2016-11-14 DIAGNOSIS — N921 Excessive and frequent menstruation with irregular cycle: Secondary | ICD-10-CM | POA: Diagnosis present

## 2016-11-14 DIAGNOSIS — F329 Major depressive disorder, single episode, unspecified: Secondary | ICD-10-CM | POA: Insufficient documentation

## 2016-11-14 DIAGNOSIS — N946 Dysmenorrhea, unspecified: Secondary | ICD-10-CM | POA: Diagnosis present

## 2016-11-14 DIAGNOSIS — N840 Polyp of corpus uteri: Secondary | ICD-10-CM | POA: Insufficient documentation

## 2016-11-14 HISTORY — PX: DILATATION & CURETTAGE/HYSTEROSCOPY WITH MYOSURE: SHX6511

## 2016-11-14 LAB — TYPE AND SCREEN
ABO/RH(D): O NEG
ANTIBODY SCREEN: NEGATIVE

## 2016-11-14 LAB — POCT PREGNANCY, URINE: PREG TEST UR: NEGATIVE

## 2016-11-14 SURGERY — DILATATION & CURETTAGE/HYSTEROSCOPY WITH MYOSURE
Anesthesia: General | Wound class: Clean Contaminated

## 2016-11-14 MED ORDER — FENTANYL CITRATE (PF) 100 MCG/2ML IJ SOLN
25.0000 ug | INTRAMUSCULAR | Status: AC | PRN
  Administered 2016-11-14 (×6): 25 ug via INTRAVENOUS

## 2016-11-14 MED ORDER — FENTANYL CITRATE (PF) 100 MCG/2ML IJ SOLN
INTRAMUSCULAR | Status: DC | PRN
  Administered 2016-11-14 (×2): 50 ug via INTRAVENOUS

## 2016-11-14 MED ORDER — ONDANSETRON HCL 4 MG/2ML IJ SOLN
INTRAMUSCULAR | Status: AC
  Filled 2016-11-14: qty 2

## 2016-11-14 MED ORDER — ONDANSETRON HCL 4 MG/2ML IJ SOLN
INTRAMUSCULAR | Status: DC | PRN
  Administered 2016-11-14: 4 mg via INTRAVENOUS

## 2016-11-14 MED ORDER — LACTATED RINGERS IV SOLN: INTRAVENOUS | Status: DC

## 2016-11-14 MED ORDER — FAMOTIDINE 20 MG PO TABS
20.0000 mg | ORAL_TABLET | Freq: Once | ORAL | Status: AC
  Administered 2016-11-14: 20 mg via ORAL

## 2016-11-14 MED ORDER — DEXAMETHASONE SODIUM PHOSPHATE 10 MG/ML IJ SOLN
INTRAMUSCULAR | Status: DC | PRN
  Administered 2016-11-14: 4 mg via INTRAVENOUS

## 2016-11-14 MED ORDER — PROPOFOL 10 MG/ML IV BOLUS
INTRAVENOUS | Status: AC
  Filled 2016-11-14: qty 20

## 2016-11-14 MED ORDER — LACTATED RINGERS IV SOLN
INTRAVENOUS | Status: DC
  Administered 2016-11-14: 09:00:00 via INTRAVENOUS

## 2016-11-14 MED ORDER — ONDANSETRON HCL 4 MG/2ML IJ SOLN: 4.0000 mg | Freq: Once | INTRAMUSCULAR | Status: DC | PRN

## 2016-11-14 MED ORDER — SILVER NITRATE-POT NITRATE 75-25 % EX MISC
CUTANEOUS | Status: AC
  Filled 2016-11-14: qty 4

## 2016-11-14 MED ORDER — MIDAZOLAM HCL 2 MG/2ML IJ SOLN
INTRAMUSCULAR | Status: AC
  Filled 2016-11-14: qty 2

## 2016-11-14 MED ORDER — MIDAZOLAM HCL 2 MG/2ML IJ SOLN
INTRAMUSCULAR | Status: DC | PRN
  Administered 2016-11-14: 2 mg via INTRAVENOUS

## 2016-11-14 MED ORDER — GLYCOPYRROLATE 0.2 MG/ML IJ SOLN
INTRAMUSCULAR | Status: DC | PRN
  Administered 2016-11-14: 0.2 mg via INTRAVENOUS

## 2016-11-14 MED ORDER — FENTANYL CITRATE (PF) 100 MCG/2ML IJ SOLN
INTRAMUSCULAR | Status: AC
  Administered 2016-11-14: 25 ug via INTRAVENOUS
  Filled 2016-11-14: qty 2

## 2016-11-14 MED ORDER — KETOROLAC TROMETHAMINE 30 MG/ML IJ SOLN
INTRAMUSCULAR | Status: DC | PRN
  Administered 2016-11-14: 30 mg via INTRAVENOUS

## 2016-11-14 MED ORDER — FENTANYL CITRATE (PF) 100 MCG/2ML IJ SOLN
INTRAMUSCULAR | Status: AC
  Filled 2016-11-14: qty 2

## 2016-11-14 MED ORDER — FAMOTIDINE 20 MG PO TABS
ORAL_TABLET | ORAL | Status: AC
  Administered 2016-11-14: 20 mg via ORAL
  Filled 2016-11-14: qty 1

## 2016-11-14 MED ORDER — IBUPROFEN 600 MG PO TABS
600.0000 mg | ORAL_TABLET | Freq: Four times a day (QID) | ORAL | 0 refills | Status: AC | PRN
Start: 2016-11-14 — End: ?

## 2016-11-14 MED ORDER — PROPOFOL 10 MG/ML IV BOLUS
INTRAVENOUS | Status: DC | PRN
  Administered 2016-11-14: 200 mg via INTRAVENOUS

## 2016-11-14 MED ORDER — SILVER NITRATE-POT NITRATE 75-25 % EX MISC
CUTANEOUS | Status: DC | PRN
  Administered 2016-11-14: 1

## 2016-11-14 SURGICAL SUPPLY — 29 items
ABLATOR ENDOMETRIAL MYOSURE (ABLATOR) IMPLANT
BAG URO DRAIN 2000ML W/SPOUT (MISCELLANEOUS) ×3 IMPLANT
CANISTER SUC SOCK COL 7IN (MISCELLANEOUS) ×3 IMPLANT
CANISTER SUCT 3000ML PPV (MISCELLANEOUS) ×3 IMPLANT
CATH FOLEY 2WAY  5CC 16FR (CATHETERS) ×2
CATH ROBINSON RED A/P 16FR (CATHETERS) ×3 IMPLANT
CATH URTH 16FR FL 2W BLN LF (CATHETERS) ×1 IMPLANT
DEVICE MYOSURE LITE (MISCELLANEOUS) ×3 IMPLANT
ELECT REM PT RETURN 9FT ADLT (ELECTROSURGICAL) ×3
ELECT RESECT POWERBALL 24F (MISCELLANEOUS) IMPLANT
ELECTRODE REM PT RTRN 9FT ADLT (ELECTROSURGICAL) ×1 IMPLANT
GLOVE BIO SURGEON STRL SZ7 (GLOVE) ×3 IMPLANT
GLOVE BIOGEL PI IND STRL 7.5 (GLOVE) ×1 IMPLANT
GLOVE BIOGEL PI INDICATOR 7.5 (GLOVE) ×2
GOWN STRL REUS W/ TWL LRG LVL3 (GOWN DISPOSABLE) ×2 IMPLANT
GOWN STRL REUS W/TWL LRG LVL3 (GOWN DISPOSABLE) ×4
IV LACTATED RINGER IRRG 3000ML (IV SOLUTION) ×2
IV LACTATED RINGERS 1000ML (IV SOLUTION) IMPLANT
IV LR IRRIG 3000ML ARTHROMATIC (IV SOLUTION) ×1 IMPLANT
KIT RM TURNOVER CYSTO AR (KITS) ×3 IMPLANT
LOOP CUT RT ANGL 28F (MISCELLANEOUS) IMPLANT
PACK DNC HYST (MISCELLANEOUS) ×3 IMPLANT
PAD OB MATERNITY 4.3X12.25 (PERSONAL CARE ITEMS) ×3 IMPLANT
PAD PREP 24X41 OB/GYN DISP (PERSONAL CARE ITEMS) ×3 IMPLANT
SEAL ROD LENS SCOPE MYOSURE (ABLATOR) ×3 IMPLANT
SYRINGE 10CC LL (SYRINGE) ×3 IMPLANT
TUBING CONNECTING 10 (TUBING) ×2 IMPLANT
TUBING CONNECTING 10' (TUBING) ×1
TUBING HYSTEROSCOPY DOLPHIN (MISCELLANEOUS) ×3 IMPLANT

## 2016-11-14 NOTE — Anesthesia Procedure Notes (Signed)
Procedure Name: LMA Insertion Performed by: Quanah Majka Pre-anesthesia Checklist: Patient identified, Patient being monitored, Timeout performed, Emergency Drugs available and Suction available Patient Re-evaluated:Patient Re-evaluated prior to inductionOxygen Delivery Method: Circle system utilized Preoxygenation: Pre-oxygenation with 100% oxygen Intubation Type: IV induction LMA: LMA inserted LMA Size: 3.5 Tube type: Oral Number of attempts: 1 Placement Confirmation: positive ETCO2 and breath sounds checked- equal and bilateral Tube secured with: Tape Dental Injury: Teeth and Oropharynx as per pre-operative assessment        

## 2016-11-14 NOTE — Anesthesia Post-op Follow-up Note (Cosign Needed)
Anesthesia QCDR form completed.        

## 2016-11-14 NOTE — Discharge Instructions (Signed)
AMBULATORY SURGERY  °DISCHARGE INSTRUCTIONS ° ° °1) The drugs that you were given will stay in your system until tomorrow so for the next 24 hours you should not: ° °A) Drive an automobile °B) Make any legal decisions °C) Drink any alcoholic beverage ° ° °2) You may resume regular meals tomorrow.  Today it is better to start with liquids and gradually work up to solid foods. ° °You may eat anything you prefer, but it is better to start with liquids, then soup and crackers, and gradually work up to solid foods. ° ° °3) Please notify your doctor immediately if you have any unusual bleeding, trouble breathing, redness and pain at the surgery site, drainage, fever, or pain not relieved by medication. ° ° ° °4) Additional Instructions: ° ° ° ° ° ° ° °Please contact your physician with any problems or Same Day Surgery at [phone], Monday through Friday 6 am to 4 pm, or [place] [facility] [place] Main number at [phone]. °

## 2016-11-14 NOTE — Anesthesia Postprocedure Evaluation (Signed)
Anesthesia Post Note  Patient: Stephanie Kaufman  Procedure(s) Performed: Procedure(s) (LRB): DILATATION & CURETTAGE/HYSTEROSCOPY WITH MYOSURE POLYPECTOMY (N/A)  Patient location during evaluation: PACU Anesthesia Type: General Level of consciousness: awake and alert Pain management: pain level controlled Vital Signs Assessment: post-procedure vital signs reviewed and stable Respiratory status: spontaneous breathing and respiratory function stable Cardiovascular status: blood pressure returned to baseline and stable Anesthetic complications: no     Last Vitals:  Vitals:   11/14/16 1052 11/14/16 1104  BP: (!) 112/52 (!) 109/54  Pulse: (!) 58 62  Resp: 16 16  Temp: 36.6 C 36.6 C    Last Pain:  Vitals:   11/14/16 1104  TempSrc: Temporal  PainSc: 1                  KEPHART,WILLIAM K

## 2016-11-14 NOTE — Transfer of Care (Signed)
Immediate Anesthesia Transfer of Care Note  Patient: Stephanie Kaufman  Procedure(s) Performed: Procedure(s): DILATATION & CURETTAGE/HYSTEROSCOPY WITH MYOSURE POLYPECTOMY (N/A)  Patient Location: PACU  Anesthesia Type:General  Level of Consciousness: sedated and responds to stimulation  Airway & Oxygen Therapy: Patient Spontanous Breathing and Patient connected to face mask oxygen  Post-op Assessment: Report given to RN and Post -op Vital signs reviewed and stable  Post vital signs: Reviewed and stable  Last Vitals:  Vitals:   11/14/16 0819 11/14/16 1001  BP: 121/87 118/75  Pulse: 73 65  Resp: 16 15  Temp: 36.7 C     Last Pain:  Vitals:   11/14/16 0819  TempSrc: Oral  PainSc: 7       Patients Stated Pain Goal: 2 (11/14/16 0819)  Complications: No apparent anesthesia complications

## 2016-11-14 NOTE — H&P (View-Only) (Signed)
Preoperative History and Physical  Stephanie Kaufman is a 41 y.o. [id number] here for surgical management of abnormal uterine bleeding (menorrhagia) and painful periods.   No significant preoperative concerns.  History of Present Illness: 41 y.o. [id number] female who presented to ER in 08/2016 after heavy 2 months of passage of large clots, soaking through clothes and pads.  She has tried provera with mild improvement. She tried a higher dose of provera which stopped bleeding.  Prior to this she had normal menses. Pelvic ultrasound showed: Adnexa: not visualized on left (but was normal on 09/05/16 ultrasound), right ovary appeared normal Uterus: with endometrial stripe  5.1 mm Additional: small lower uterine segment endometrial polyp (~0.7 x 0.3 mm).  Two large Nabothian cysts (largest 2.3 cm in largest dimension).   Proposed surgery: hysteroscopy, dilation and curettage, and polypectomy  Past Medical History:  Diagnosis Date  . Depression   . Hypertension   . Migraine    Past Surgical History:  Procedure Laterality Date  . CESAREAN SECTION     OB History  Gravida Para Term Preterm AB Living  2 2 2     2   SAB TAB Ectopic Multiple Live Births          2    # Outcome Date GA Lbr Len/2nd Weight Sex Delivery Anes PTL Lv  2 Term           1 Term             Patient denies any other pertinent gynecologic issues.   Current Outpatient Prescriptions on File Prior to Visit  Medication Sig Dispense Refill  . lisinopril (PRINIVIL,ZESTRIL) 2.5 MG tablet Take 2.5 mg by mouth daily.    Marland Kitchen. topiramate (TOPAMAX) 50 MG tablet Take 50 mg by mouth 2 (two) times daily.     No current facility-administered medications on file prior to visit.    No Known Allergies  Social History:   reports that she has been smoking Cigarettes.  She has been smoking about 0.50 packs per day. She has never used smokeless tobacco. She reports that she drinks alcohol. She reports that she does not use drugs.  History  reviewed. No pertinent family history.  Review of Systems: Noncontributory  PHYSICAL EXAM: Blood pressure 128/74, height 5\' 7"  (1.702 m), weight 232 lb (105.2 kg). CONSTITUTIONAL: Well-developed, well-nourished female in no acute distress.  HENT:  Normocephalic, atraumatic, External right and left ear normal. Oropharynx is clear and moist EYES: Conjunctivae and EOM are normal. Pupils are equal, round, and reactive to light. No scleral icterus.  NECK: Normal range of motion, supple, no masses SKIN: Skin is warm and dry. No rash noted. Not diaphoretic. No erythema. No pallor. NEUROLGIC: Alert and oriented to person, place, and time. Normal reflexes, muscle tone coordination. No cranial nerve deficit noted. PSYCHIATRIC: Normal mood and affect. Normal behavior. Normal judgment and thought content. CARDIOVASCULAR: Normal heart rate noted, regular rhythm RESPIRATORY: Effort and breath sounds normal, no problems with respiration noted ABDOMEN: Soft, nontender, nondistended. PELVIC: Deferred MUSCULOSKELETAL: Normal range of motion. No edema and no tenderness. 2+ distal pulses.   Assessment: Patient Active Problem List   Diagnosis Date Noted  . Endometrial polyp 10/25/2016  . Menorrhagia with irregular cycle 10/02/2016  . Dysmenorrhea 10/02/2016    Plan: Patient will undergo surgical management with hysteroscopy, dilation and curettage, and polypectomy.   The risks of surgery were discussed in detail with the patient including but not limited to: bleeding which may require  transfusion or reoperation; infection which may require antibiotics; injury to surrounding organs which may involve bowel, bladder, ureters ; need for additional procedures including laparoscopy or laparotomy; thromboembolic phenomenon, surgical site problems and other postoperative/anesthesia complications. Likelihood of success in alleviating the patient's condition was discussed. Routine postoperative instructions will be  reviewed with the patient and her family in detail after surgery.  The patient concurred with the proposed plan, giving informed written consent for the surgery.  Preoperative prophylactic antibiotics (if indicated) and SCDs ordered on call to the OR.    Thomasene Mohair, MD 11/11/2016 1:49 PM

## 2016-11-14 NOTE — Interval H&P Note (Signed)
History and Physical Interval Note:  11/14/2016 8:46 AM  Stephanie Kaufman  has presented today for surgery, with the diagnosis of MENORRHAGIA WITH IRREGULAR CYCLE,DYSMENORRHEA,ENDOMETRIAL POLYP  The various methods of treatment have been discussed with the patient and family. After consideration of risks, benefits and other options for treatment, the patient has consented to  Procedure(s): DILATATION & CURETTAGE/HYSTEROSCOPY WITH MYOSURE (N/A) CERVICAL POLYPECTOMY (N/A) as a surgical intervention .  The patient's history has been reviewed, patient examined, no change in status, stable for surgery.  I have reviewed the patient's chart and labs.  Questions were answered to the patient's satisfaction.  The consents were reviewed and she agrees to proceed.  Thomasene MohairStephen Brandyn Thien, MD 11/14/2016 8:46 AM

## 2016-11-14 NOTE — Anesthesia Preprocedure Evaluation (Signed)
Anesthesia Evaluation  Patient identified by MRN, date of birth, ID band Patient awake    Reviewed: Allergy & Precautions, NPO status , Patient's Chart, lab work & pertinent test results  History of Anesthesia Complications Negative for: history of anesthetic complications  Airway Mallampati: II       Dental   Pulmonary neg pulmonary ROS, Current Smoker,           Cardiovascular hypertension, Pt. on medications      Neuro/Psych Depression negative neurological ROS     GI/Hepatic negative GI ROS, Neg liver ROS,   Endo/Other  negative endocrine ROS  Renal/GU negative Renal ROS     Musculoskeletal   Abdominal   Peds  Hematology negative hematology ROS (+)   Anesthesia Other Findings   Reproductive/Obstetrics                             Anesthesia Physical Anesthesia Plan  ASA: II  Anesthesia Plan: General   Post-op Pain Management:    Induction: Intravenous  Airway Management Planned: LMA  Additional Equipment:   Intra-op Plan:   Post-operative Plan:   Informed Consent: I have reviewed the patients History and Physical, chart, labs and discussed the procedure including the risks, benefits and alternatives for the proposed anesthesia with the patient or authorized representative who has indicated his/her understanding and acceptance.     Plan Discussed with:   Anesthesia Plan Comments:         Anesthesia Quick Evaluation

## 2016-11-14 NOTE — Op Note (Signed)
Operative Note   11/14/2016  PRE-OP DIAGNOSIS:  1) menorrhagia with irregular cycle 2) dysmenorrhea 3) endometrial polyp    POST-OP DIAGNOSIS:  1) menorrhagia with irregular cycle 2) dysmenorrhea 3) endometrial polyp   SURGEON: Surgeon(s) and Role:    Conard Novak* Tabrina Esty D, MD - Primary  PROCEDURE: Procedure(s): DILATATION & CURETTAGE/HYSTEROSCOPY WITH MYOSURE POLYPECTOMY   ANESTHESIA: general  ESTIMATED BLOOD LOSS: 20 mL  DRAINS: none   TOTAL IV FLUIDS: 400 mL crystalloid  SPECIMENS:  Endometrial curetting and endometrial polyp  VTE PROPHYLAXIS: SCDs to the bilateral lower extremities  ANTIBIOTICS: none indicated and none given  FLUID DEFICIT: 70 mL  COMPLICATIONS: none  DISPOSITION: PACU - hemodynamically stable.  CONDITION: stable  INDICATION: 41 y.o. female with several month history of heavy, painful periods.  Workup revealed endometrial polyp.   FINDINGS: Exam under anesthesia revealed small, mobile anteverted uterus with no masses and bilateral adnexa without masses or fullness. Hysteroscopy revealed a left cornual polyp (small, approximately 3 x 10 mm), otherwise grossly normal appearing uterine cavity with bilateral tubal ostia and normal appearing endocervical canal.  PROCEDURE IN DETAIL:  After informed consent was obtained, the patient was taken to the operating room where anesthesia was obtained without difficulty. The patient was positioned in the dorsal lithotomy position in candy cane stirrups.  The patient's bladder was catheterized with an in and out foley catheter.  The patient was examined under anesthesia, with the above noted findings.  The bi-valved speculum was placed inside the patient's vagina, and the the anterior lip of the cervix was seen and grasped with the tenaculum.  The cervix was progressively dilated to a 7 mm Hegar dilator.  The hysteroscope was introduced, with the above noted findings.  The myosure light device was affixed to the  MyoSure hysteroscope.  The left cornual polyp was removed easily.   The hystersocope was removed and the uterine cavity was curetted until a gritty texture was noted, yielding moderate endometrial curettings.  Excellent hemostasis was noted, and all instruments were removed, with excellent hemostasis noted throughout.  She was then taken out of dorsal lithotomy.  The patient tolerated the procedure well.  Sponge, lap and needle counts were correct x2.  The patient was taken to recovery room in excellent condition.  Conard NovakStephen D. Hyatt Capobianco, MD 11/14/2016 9:51 AM

## 2016-11-15 LAB — SURGICAL PATHOLOGY

## 2016-12-02 ENCOUNTER — Ambulatory Visit (INDEPENDENT_AMBULATORY_CARE_PROVIDER_SITE_OTHER): Payer: Managed Care, Other (non HMO) | Admitting: Obstetrics and Gynecology

## 2016-12-02 ENCOUNTER — Encounter: Payer: Self-pay | Admitting: Obstetrics and Gynecology

## 2016-12-02 VITALS — BP 112/64 | HR 75 | Ht 67.0 in | Wt 234.0 lb

## 2016-12-02 DIAGNOSIS — Z09 Encounter for follow-up examination after completed treatment for conditions other than malignant neoplasm: Secondary | ICD-10-CM

## 2016-12-02 DIAGNOSIS — N840 Polyp of corpus uteri: Secondary | ICD-10-CM

## 2016-12-02 DIAGNOSIS — N921 Excessive and frequent menstruation with irregular cycle: Secondary | ICD-10-CM

## 2016-12-02 NOTE — Progress Notes (Signed)
   Postoperative Follow-up Patient presents post op from hysteroscopy, dilation and curettage 2weeks ago for abnormal uterine bleeding and endometrial  polyp.  Subjective: Patient reports marked improvement in her preop symptoms. Eating a regular diet without difficulty. The patient is not having any pain.  She did have a day a week ago where she had RLQ pain that was fairly severe. It went away without treatment. Activity: normal activities of daily living.  Objective: Vital Signs: BP 112/64 (BP Location: Left Arm, Patient Position: Sitting, Cuff Size: Large)   Pulse 75   Ht 5\' 7"  (1.702 m)   Wt 234 lb (106.1 kg)   LMP 11/06/2016   BMI 36.65 kg/m  Constitutional: Well nourished, well developed female in no acute distress.  HEENT: normal Skin: Warm and dry.  Extremity: no edema  Abdomen: Soft, non-tender, normal bowel sounds; no bruits, organomegaly or masses.   Assessment: 41 y.o. s/p hysteroscopy, D&C, polypectomy progressing well  Plan: Patient has done well after surgery with no apparent complications.  I have discussed the post-operative course to date, and the expected progress moving forward.  The patient understands what complications to be concerned about.  I will see the patient in routine follow up, or sooner if needed.    Activity plan: No restriction.  Thomasene MohairStephen Laprecious Austill, MD 12/02/2016, 2:10 PM

## 2017-04-07 ENCOUNTER — Encounter: Payer: Self-pay | Admitting: Emergency Medicine

## 2017-04-07 ENCOUNTER — Emergency Department: Payer: Managed Care, Other (non HMO)

## 2017-04-07 ENCOUNTER — Emergency Department
Admission: EM | Admit: 2017-04-07 | Discharge: 2017-04-07 | Disposition: A | Discharge status: Home / Self Care | Payer: Managed Care, Other (non HMO) | Attending: Emergency Medicine | Admitting: Emergency Medicine

## 2017-04-07 DIAGNOSIS — I10 Essential (primary) hypertension: Secondary | ICD-10-CM | POA: Diagnosis not present

## 2017-04-07 DIAGNOSIS — F17210 Nicotine dependence, cigarettes, uncomplicated: Secondary | ICD-10-CM | POA: Diagnosis not present

## 2017-04-07 DIAGNOSIS — N23 Unspecified renal colic: Secondary | ICD-10-CM

## 2017-04-07 DIAGNOSIS — N132 Hydronephrosis with renal and ureteral calculous obstruction: Secondary | ICD-10-CM | POA: Insufficient documentation

## 2017-04-07 DIAGNOSIS — R109 Unspecified abdominal pain: Secondary | ICD-10-CM | POA: Diagnosis present

## 2017-04-07 DIAGNOSIS — Z79899 Other long term (current) drug therapy: Secondary | ICD-10-CM | POA: Diagnosis not present

## 2017-04-07 LAB — CBC
HCT: 37.4 % (ref 35.0–47.0)
HEMOGLOBIN: 12.4 g/dL (ref 12.0–16.0)
MCH: 27.8 pg (ref 26.0–34.0)
MCHC: 33.2 g/dL (ref 32.0–36.0)
MCV: 83.6 fL (ref 80.0–100.0)
Platelets: 197 10*3/uL (ref 150–440)
RBC: 4.48 MIL/uL (ref 3.80–5.20)
RDW: 14.9 % — ABNORMAL HIGH (ref 11.5–14.5)
WBC: 13.6 10*3/uL — ABNORMAL HIGH (ref 3.6–11.0)

## 2017-04-07 LAB — BASIC METABOLIC PANEL
Anion gap: 5 (ref 5–15)
BUN: 30 mg/dL — ABNORMAL HIGH (ref 6–20)
CHLORIDE: 112 mmol/L — AB (ref 101–111)
CO2: 22 mmol/L (ref 22–32)
CREATININE: 1.08 mg/dL — AB (ref 0.44–1.00)
Calcium: 8.8 mg/dL — ABNORMAL LOW (ref 8.9–10.3)
GFR calc Af Amer: >60 mL/min (ref >60)
GFR calc non Af Amer: >60 mL/min (ref >60)
GLUCOSE: 113 mg/dL — AB (ref 65–99)
Potassium: 4.2 mmol/L (ref 3.5–5.1)
Sodium: 139 mmol/L (ref 135–145)

## 2017-04-07 LAB — URINALYSIS, COMPLETE (UACMP) WITH MICROSCOPIC
BILIRUBIN URINE: NEGATIVE
GLUCOSE, UA: NEGATIVE mg/dL
Ketones, ur: NEGATIVE mg/dL
LEUKOCYTES UA: NEGATIVE
NITRITE: NEGATIVE
PH: 5 (ref 5.0–8.0)
Protein, ur: NEGATIVE mg/dL
SPECIFIC GRAVITY, URINE: 1.016 (ref 1.005–1.030)

## 2017-04-07 LAB — PREGNANCY, URINE: Preg Test, Ur: NEGATIVE

## 2017-04-07 MED ORDER — KETOROLAC TROMETHAMINE 30 MG/ML IJ SOLN
30.0000 mg | Freq: Once | INTRAMUSCULAR | Status: AC
  Administered 2017-04-07: 30 mg via INTRAVENOUS
  Filled 2017-04-07: qty 1

## 2017-04-07 MED ORDER — OXYCODONE-ACETAMINOPHEN 5-325 MG PO TABS: 1.0000 | ORAL_TABLET | Freq: Four times a day (QID) | ORAL | 0 refills | Status: AC | PRN

## 2017-04-07 MED ORDER — MORPHINE SULFATE (PF) 4 MG/ML IV SOLN
4.0000 mg | Freq: Once | INTRAVENOUS | Status: AC
  Administered 2017-04-07: 4 mg via INTRAVENOUS
  Filled 2017-04-07: qty 1

## 2017-04-07 MED ORDER — ONDANSETRON HCL 4 MG/2ML IJ SOLN
4.0000 mg | Freq: Once | INTRAMUSCULAR | Status: AC
  Administered 2017-04-07: 4 mg via INTRAVENOUS
  Filled 2017-04-07: qty 2

## 2017-04-07 MED ORDER — HYDROMORPHONE HCL 1 MG/ML IJ SOLN
0.5000 mg | Freq: Once | INTRAMUSCULAR | Status: AC
  Administered 2017-04-07: 0.5 mg via INTRAVENOUS
  Filled 2017-04-07: qty 1

## 2017-04-07 NOTE — Discharge Instructions (Addendum)
Please return for worse pain fever or vomiting. Take the Percocet 1 pill 4 times a day as needed for pain. He careful the Percocet can make you sleepy and constipated. Do not drive on it. Call the urologist to arrange for follow-up. She called them today they should probably be on a CT within about a week. Strain all urine try to catch the stone and take it with you. The urologist may wish to have it analyzed.

## 2017-04-07 NOTE — ED Triage Notes (Signed)
Patient with complaint of left flank pain and nausea that started about 00:30 this morning. Patient states that she is also having pain with urination. Patient states that she has a history of kidney stone and that the pain feels the same.

## 2017-04-07 NOTE — ED Provider Notes (Signed)
[facility] Emergency Department Provider Note   ____________________________________________   First MD Initiated Contact with Patient 04/07/17 [phone]     (approximate)  I have reviewed the triage vital signs and the nursing notes.   HISTORY  Chief Complaint Flank Pain   HPI Stephanie Kaufman is a 41 y.o. female patient reports onset of left flank pain radiating around to her abdomen at 3:30 this morning. She said her whole belly was hurting in the front as well. Still somewhat tender there. She says the pain feels like her kidney stone pain and she's had several times in the past. Pain is moderately severe at present and achy   Past Medical History:  Diagnosis Date  . Depression   . History of kidney stones   . Hypertension   . Migraine    MIGRAINES    Patient Active Problem List   Diagnosis Date Noted  . Endometrial polyp 10/25/2016  . Menorrhagia with irregular cycle 10/02/2016  . Dysmenorrhea 10/02/2016    Past Surgical History:  Procedure Laterality Date  . CESAREAN SECTION     X2  . DILATATION & CURETTAGE/HYSTEROSCOPY WITH MYOSURE N/A 11/14/2016   Procedure: DILATATION & CURETTAGE/HYSTEROSCOPY WITH MYOSURE POLYPECTOMY;  Surgeon: Conard Novak, MD;  Location: [facility] ORS;  Service: Gynecology;  Laterality: N/A;    Prior to Admission medications   Medication Sig Start Date End Date Taking? Authorizing Provider  lisinopril (PRINIVIL,ZESTRIL) 2.5 MG tablet Take 2.5 mg by mouth at bedtime.    Yes [provider]  topiramate (TOPAMAX) 50 MG tablet Take 50 mg by mouth 2 (two) times daily.   Yes [provider]  butalbital-aspirin-caffeine Benny Lennert) 50-325-40 MG tablet Take 1 tablet by mouth 2 (two) times daily as needed for headache.    [provider]  ibuprofen (ADVIL,MOTRIN) 600 MG tablet Take 1 tablet (600 mg total) by mouth every 6 (six) hours as needed for mild pain or cramping. 11/14/16   Conard Novak, MD    naproxen (NAPROSYN) 500 MG tablet Take 500 mg by mouth as needed.    [provider]  oxyCODONE-acetaminophen (ROXICET) 5-325 MG tablet Take 1 tablet by mouth every 6 (six) hours as needed. 04/07/17 04/07/18  Arnaldo Natal, MD    Allergies Patient has no known allergies.  No family history on file.  Social History Social History  Substance Use Topics  . Smoking status: Current Every Day Smoker    Packs/day: 0.50    Years: 17.00    Types: Cigarettes  . Smokeless tobacco: Never Used  . Alcohol use Yes     Comment: rare    Review of Systems  Constitutional: No fever/chills Eyes: No visual changes. ENT: No sore throat. Cardiovascular: Denies chest pain. Respiratory: Denies shortness of breath. Gastrointestinal: abdominal pain.  No nausea, no vomiting.  No diarrhea.  No constipation. Genitourinary: Negative for dysuria. Musculoskeletal: Negative for back pain. Skin: Negative for rash. Neurological: Negative for headaches, focal weakness  ____________________________________________   PHYSICAL EXAM:  VITAL SIGNS: ED Triage Vitals  Enc Vitals Group     BP 04/07/17 0518 (!) 164/89     Pulse Rate 04/07/17 0518 88     Resp 04/07/17 0518 18     Temp 04/07/17 0518 98.8 F (37.1 C)     Temp Source 04/07/17 0518 Oral     SpO2 04/07/17 0518 97 %     Weight 04/07/17 0515 230 lb (104.3 kg)     Height 04/07/17  0515 $RemoveBef 2 m)     Head Circumference --      Peak Flow --      Pain Score 04/07/17 0514 8     Pain Loc --      Pain Edu? --      Excl. in GC? --    Constitutional: Alert and oriented. Well appearing and in Some acute distress. Eyes: Conjunctivae are normal.  Head: Atraumatic. Nose: No congestion/rhinnorhea. Mouth/Throat: Mucous membranes are moist.  Oropharynx non-erythematous. Neck: No stridor.   Cardiovascular: Normal rate, regular rhythm. Grossly normal heart sounds.  Good peripheral circulation. Respiratory: Normal respiratory effort.  No  retractions. Lungs CTAB. Gastrointestinal: Soft mildly diffusely tender No distention. No abdominal bruits. No CVA tenderness. Musculoskeletal: No lower extremity tenderness nor edema.  No joint effusions. Neurologic:  Normal speech and language. No gross focal neurologic deficits are appreciated. No gait instability. Skin:  Skin is warm, dry and intact. No rash noted. Psychiatric: Mood and affect are normal. Speech and behavior are normal.  ____________________________________________   LABS (all labs ordered are listed, but only abnormal results are displayed)  Labs Reviewed  URINALYSIS, COMPLETE (UACMP) WITH MICROSCOPIC - Abnormal; Notable for the following:       Result Value   Color, Urine STRAW (*)    APPearance CLEAR (*)    Hgb urine dipstick SMALL (*)    Bacteria, UA RARE (*)    Squamous Epithelial / LPF 0-5 (*)    All other components within normal limits  BASIC METABOLIC PANEL - Abnormal; Notable for the following:    Chloride 112 (*)    Glucose, Bld 113 (*)    BUN 30 (*)    Creatinine, Ser 1.08 (*)    Calcium 8.8 (*)    All other components within normal limits  CBC - Abnormal; Notable for the following:    WBC 13.6 (*)    RDW 14.9 (*)    All other components within normal limits  PREGNANCY, URINE  LIPASE, BLOOD  HEPATIC FUNCTION PANEL   ____________________________________________  EKG   ____________________________________________  RADIOLOGY  IMPRESSION: 1. Left-sided obstructive uropathy with 5 x 4 x 7 mm obstructing stone at the left ureterovesical junction causing moderate left hydroureteronephrosis and perinephric stranding. 2. Second, nonobstructing left interpolar calculus measuring 3 mm. 3. No right nephrolithiasis.   Electronically Signed   By: Deatra Robinson M.D.   On: 04/07/2017 06:26  ___Films reviewed see the stones radiologist as mentioning _________________________________________   PROCEDURES  Procedure(s) performed:    Procedures  Critical Care performed:   ____________________________________________   INITIAL IMPRESSION / ASSESSMENT AND PLAN / ED COURSE  As part of my medical decision making, I reviewed the following data within the electronic MEDICAL RECORD NUMBER         ____________________________________________   FINAL CLINICAL IMPRESSION(S) / ED DIAGNOSES  Final diagnoses:  Ureteral colic      NEW MEDICATIONS STARTED DURING THIS VISIT:  New Prescriptions   OXYCODONE-ACETAMINOPHEN (ROXICET) 5-325 MG TABLET    Take 1 tablet by mouth every 6 (six) hours as needed.     Note:  This document was prepared using Dragon voice recognition software and may include unintentional dictation errors.    Arnaldo Natal, MD 04/07/17 [phone]

## 2017-12-04 ENCOUNTER — Encounter: Payer: Self-pay | Admitting: Medical Oncology

## 2017-12-04 ENCOUNTER — Emergency Department
Admission: EM | Admit: 2017-12-04 | Discharge: 2017-12-04 | Disposition: A | Discharge status: Home / Self Care | Payer: 59 | Attending: Emergency Medicine | Admitting: Emergency Medicine

## 2017-12-04 DIAGNOSIS — H53149 Visual discomfort, unspecified: Secondary | ICD-10-CM | POA: Diagnosis not present

## 2017-12-04 DIAGNOSIS — H538 Other visual disturbances: Secondary | ICD-10-CM | POA: Insufficient documentation

## 2017-12-04 DIAGNOSIS — R51 Headache: Secondary | ICD-10-CM | POA: Insufficient documentation

## 2017-12-04 DIAGNOSIS — I10 Essential (primary) hypertension: Secondary | ICD-10-CM | POA: Diagnosis not present

## 2017-12-04 DIAGNOSIS — F17210 Nicotine dependence, cigarettes, uncomplicated: Secondary | ICD-10-CM | POA: Insufficient documentation

## 2017-12-04 DIAGNOSIS — R519 Headache, unspecified: Secondary | ICD-10-CM

## 2017-12-04 LAB — CBC WITH DIFFERENTIAL/PLATELET
Basophils Absolute: 0.1 10*3/uL (ref 0–0.1)
Basophils Relative: 1 %
Eosinophils Absolute: 0.2 10*3/uL (ref 0–0.7)
Eosinophils Relative: 3 %
HEMATOCRIT: 38.3 % (ref 35.0–47.0)
Hemoglobin: 12.9 g/dL (ref 12.0–16.0)
LYMPHS PCT: 30 %
Lymphs Abs: 1.7 10*3/uL (ref 1.0–3.6)
MCH: 27.2 pg (ref 26.0–34.0)
MCHC: 33.8 g/dL (ref 32.0–36.0)
MCV: 80.7 fL (ref 80.0–100.0)
MONOS PCT: 10 %
Monocytes Absolute: 0.6 10*3/uL (ref 0.2–0.9)
NEUTROS ABS: 3.1 10*3/uL (ref 1.4–6.5)
Neutrophils Relative %: 56 %
Platelets: 190 10*3/uL (ref 150–440)
RBC: 4.75 MIL/uL (ref 3.80–5.20)
RDW: 16.2 % — AB (ref 11.5–14.5)
WBC: 5.7 10*3/uL (ref 3.6–11.0)

## 2017-12-04 LAB — BASIC METABOLIC PANEL
Anion gap: 8 (ref 5–15)
BUN: 15 mg/dL (ref 6–20)
CALCIUM: 8.9 mg/dL (ref 8.9–10.3)
CO2: 23 mmol/L (ref 22–32)
CREATININE: 0.67 mg/dL (ref 0.44–1.00)
Chloride: 106 mmol/L (ref 101–111)
GFR calc Af Amer: >60 mL/min (ref >60)
GFR calc non Af Amer: >60 mL/min (ref >60)
GLUCOSE: 92 mg/dL (ref 65–99)
Potassium: 3.9 mmol/L (ref 3.5–5.1)
Sodium: 137 mmol/L (ref 135–145)

## 2017-12-04 MED ORDER — LORAZEPAM 2 MG/ML IJ SOLN
0.5000 mg | Freq: Once | INTRAMUSCULAR | Status: AC
  Administered 2017-12-04: 0.5 mg via INTRAVENOUS
  Filled 2017-12-04: qty 1

## 2017-12-04 MED ORDER — BUTALBITAL-APAP-CAFFEINE 50-325-40 MG PO TABS: 1.0000 | ORAL_TABLET | Freq: Four times a day (QID) | ORAL | 0 refills | Status: AC | PRN

## 2017-12-04 MED ORDER — METOCLOPRAMIDE HCL 5 MG/ML IJ SOLN
10.0000 mg | Freq: Once | INTRAMUSCULAR | Status: AC
  Administered 2017-12-04: 10 mg via INTRAVENOUS
  Filled 2017-12-04: qty 2

## 2017-12-04 MED ORDER — SODIUM CHLORIDE 0.9 % IV SOLN
Freq: Once | INTRAVENOUS | Status: AC
  Administered 2017-12-04: 11:00:00 via INTRAVENOUS

## 2017-12-04 MED ORDER — KETOROLAC TROMETHAMINE 30 MG/ML IJ SOLN
30.0000 mg | Freq: Once | INTRAMUSCULAR | Status: AC
  Administered 2017-12-04: 30 mg via INTRAVENOUS
  Filled 2017-12-04: qty 1

## 2017-12-04 NOTE — ED Provider Notes (Signed)
[facility] Emergency Department Provider Note       Time seen: ----------------------------------------- 11:05 AM on 12/04/2017 -----------------------------------------   I have reviewed the triage vital signs and the nursing notes.  HISTORY   Chief Complaint Migraine    HPI Stephanie Kaufman is a 42 y.o. female with a history of depression, hypertension and migraines who presents to the ED for headache that was worse this morning.  Patient reports a headache since Monday with a history of migraines.  Patient states typically it is behind both eyes and this 1 seems worse than normal.  She is having light sensitivity but no sensitivity to sound.  She has not had nausea or vomiting.  She describes some blurry vision and not feeling quite right.  She denies recent illness or other complaints.  She is currently on the last day of her menstrual cycle.  Past Medical History:  Diagnosis Date  . Depression   . History of kidney stones   . Hypertension   . Migraine    MIGRAINES    Patient Active Problem List   Diagnosis Date Noted  . Endometrial polyp 10/25/2016  . Menorrhagia with irregular cycle 10/02/2016  . Dysmenorrhea 10/02/2016    Past Surgical History:  Procedure Laterality Date  . CESAREAN SECTION     X2  . DILATATION & CURETTAGE/HYSTEROSCOPY WITH MYOSURE N/A 11/14/2016   Procedure: DILATATION & CURETTAGE/HYSTEROSCOPY WITH MYOSURE POLYPECTOMY;  Surgeon: Conard Novak, MD;  Location: [facility] ORS;  Service: Gynecology;  Laterality: N/A;    Allergies Patient has no known allergies.  Social History Social History   Tobacco Use  . Smoking status: Current Every Day Smoker    Packs/day: 0.50    Years: 17.00    Pack years: 8.50    Types: Cigarettes  . Smokeless tobacco: Never Used  Substance Use Topics  . Alcohol use: Yes    Comment: rare  . Drug use: No   Review of Systems Constitutional: Negative for fever. Cardiovascular: Negative  for chest pain. Respiratory: Negative for shortness of breath. Gastrointestinal: Negative for abdominal pain, vomiting and diarrhea. Musculoskeletal: Negative for back pain. Skin: Negative for rash. Neurological: Positive for headache  All systems negative/normal/unremarkable except as stated in the HPI  ____________________________________________   PHYSICAL EXAM:  VITAL SIGNS: ED Triage Vitals  Enc Vitals Group     BP 12/04/17 1028 (!) 158/96     Pulse Rate 12/04/17 1028 67     Resp 12/04/17 1028 18     Temp 12/04/17 1028 98.2 F (36.8 C)     Temp Source 12/04/17 1028 Oral     SpO2 12/04/17 1028 100 %     Weight 12/04/17 1029 225 lb (102.1 kg)     Height 12/04/17 1029 5\' 7"  (1.702 m)     Head Circumference --      Peak Flow --      Pain Score 12/04/17 1029 8     Pain Loc --      Pain Edu? --      Excl. in GC? --    Constitutional: Alert and oriented. Well appearing and in no distress. Eyes: Conjunctivae are normal. Normal extraocular movements.  No photophobia is noted ENT   Head: Normocephalic and atraumatic.   Nose: No congestion/rhinnorhea.   Mouth/Throat: Mucous membranes are moist.   Neck: No stridor. Cardiovascular: Normal rate, regular rhythm. No murmurs, rubs, or gallops. Respiratory: Normal respiratory effort without tachypnea nor retractions. Breath sounds are clear and  equal bilaterally. No wheezes/rales/rhonchi. Gastrointestinal: Soft and nontender. Normal bowel sounds Musculoskeletal: Nontender with normal range of motion in extremities. No lower extremity tenderness nor edema. Neurologic:  Normal speech and language. No gross focal neurologic deficits are appreciated.  Strength, sensation and cranial nerves are normal Skin:  Skin is warm, dry and intact. No rash noted. Psychiatric: Mood and affect are normal. Speech and behavior are normal.  ____________________________________________  ED COURSE:  As part of my medical decision making, I  reviewed the following data within the electronic MEDICAL RECORD NUMBER History obtained from family if available, nursing notes, old chart and ekg, as well as notes from prior ED visits. Patient presented for headache, we will assess with labs and give IV headache cocktail   Procedures ____________________________________________   LABS (pertinent positives/negatives)  Labs Reviewed  CBC WITH DIFFERENTIAL/PLATELET - Abnormal; Notable for the following components:      Result Value   RDW 16.2 (*)    All other components within normal limits  BASIC METABOLIC PANEL   ____________________________________________  DIFFERENTIAL DIAGNOSIS   Migraine headache, tension headache, dehydration, anemia  FINAL ASSESSMENT AND PLAN  Headache  Plan: The patient had presented for worsening headache. Patient's labs were unremarkable.  Patient reports improvement in her symptoms after IV headache cocktail.  No clear etiology for her headache.  She is stable for outpatient follow-up.   Ulice DashJohnathan E Williams, MD   Note: This note was generated in part or whole with voice recognition software. Voice recognition is usually quite accurate but there are transcription errors that can and very often do occur. I apologize for any typographical errors that were not detected and corrected.     Emily FilbertWilliams, Jonathan E, MD 12/04/17 1320

## 2017-12-04 NOTE — ED Notes (Signed)
Pt states she is feeling better at this time. NAD noted. Pt resting in bed on phone. Will continue to monitor for further patient needs.

## 2017-12-04 NOTE — ED Notes (Signed)
NAD noted at time of D/C. Pt denies questions or concerns. Pt ambulatory to the lobby at this time.  

## 2017-12-04 NOTE — ED Triage Notes (Signed)
Pt reports she has had headache since Monday- hx of migraine headaches. Pt reports over the past couple days she has just been feeling "off and foggy". Pt A/O x 4 and ambulatory to triage.

## 2017-12-04 NOTE — ED Notes (Signed)
Pt states awoke Monday morning with migraine HA. Pt states hx of migraines. Pt states pain behind her eyes that radiates to posterior skull. Pt states was driving down the road when her vision became blurry and she pulled over. Pt states generally this migraine is "worse than the others". Pt is neurologically intact, A&O x 4, able speak in complete sentences without difficulty. Will continue to monitor for further patient needs.

## 2017-12-04 NOTE — ED Notes (Signed)
This RN spoke with MD, pt states she does not have a ride, per MD give medication.

## 2018-03-30 IMAGING — CT CT RENAL STONE PROTOCOL
2 of 4 series · 16 of 46 positions shown, 18 images · non-contrast
Comparison: CT abdomen pelvis 06/28/2014

CLINICAL DATA: Left flank pain and nausea

EXAM:
CT ABDOMEN AND PELVIS WITHOUT CONTRAST
TECHNIQUE: Multidetector CT imaging of the abdomen and pelvis was performed
following the standard protocol without IV contrast.

[Series 2: stone full standard · axial · 0.74mm/px · z∈[-1054,-624]mm · 13 of 96 slices shown, 15 images]
[im 5/96  soft-tissue]
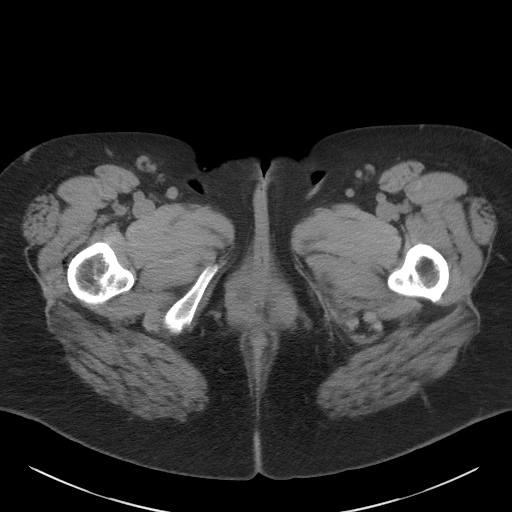
[im 5/96  bone]
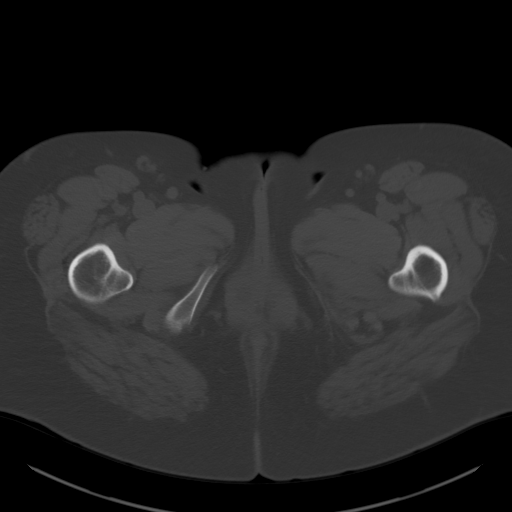
[im 13/96  soft-tissue]
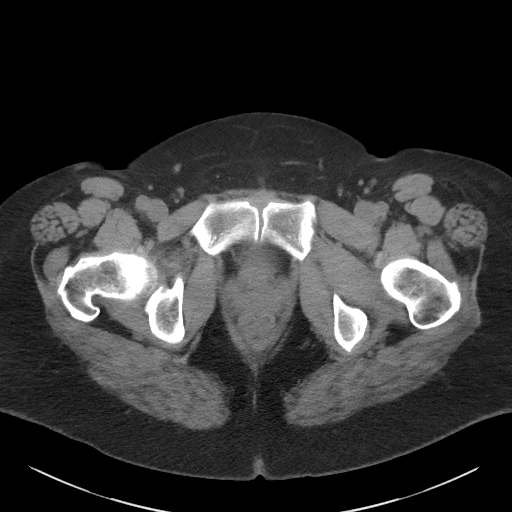
[im 21/96  soft-tissue]
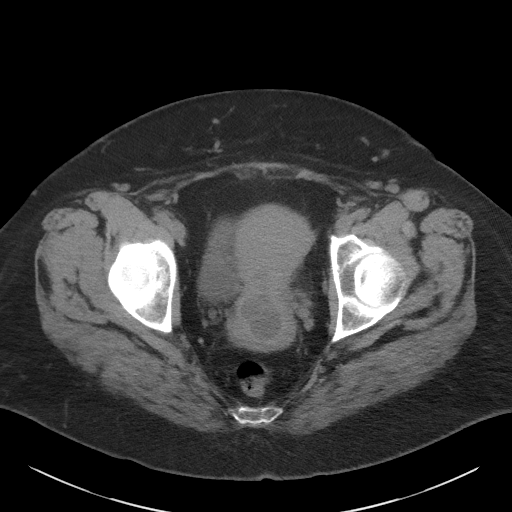
[im 25/96  soft-tissue]
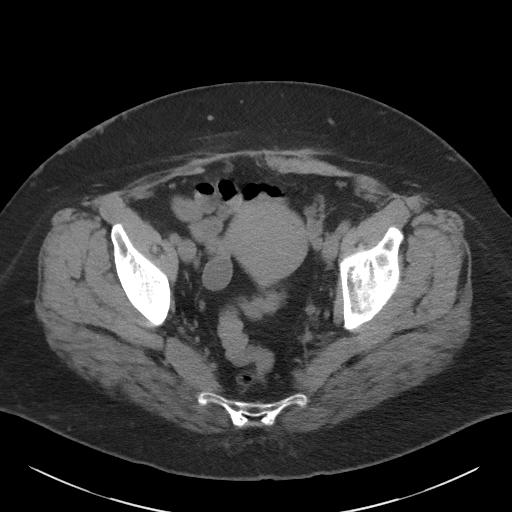
[im 34/96  soft-tissue]
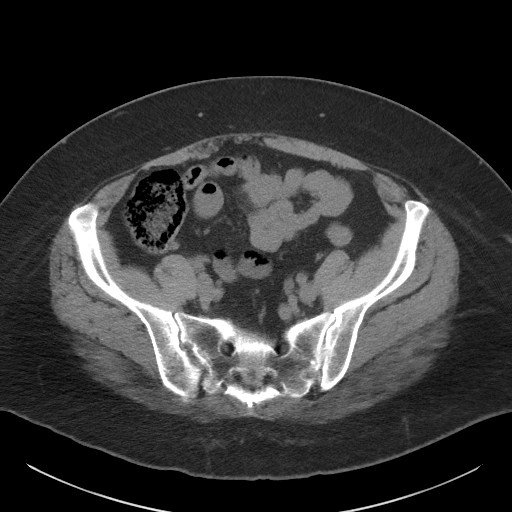
[im 42/96  soft-tissue]
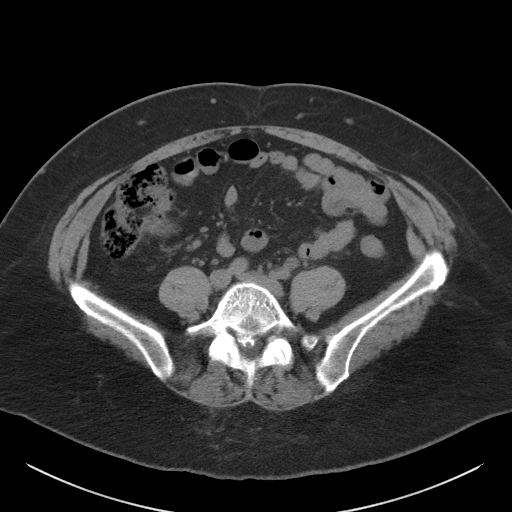
[im 50/96  soft-tissue]
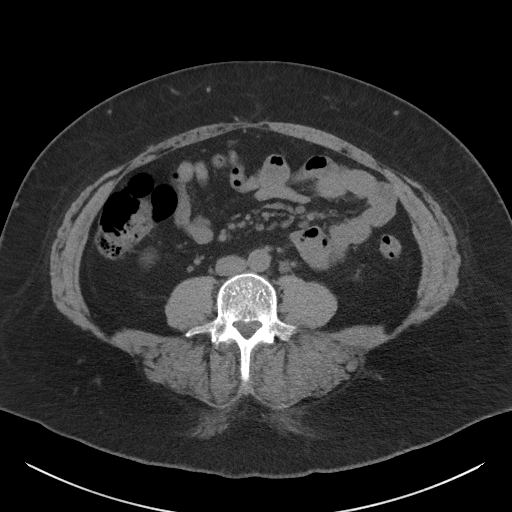
[im 54/96  soft-tissue]
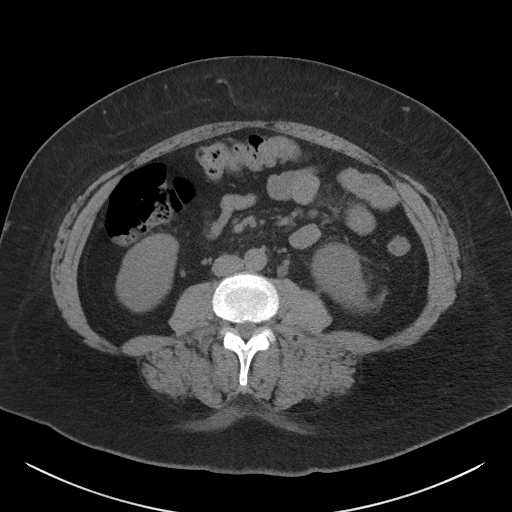
[im 62/96  soft-tissue]
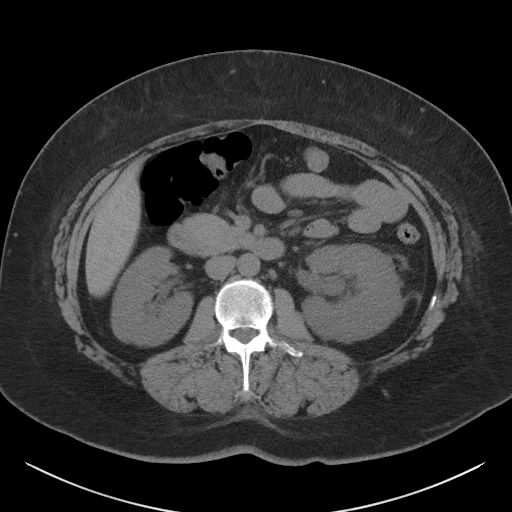
[im 62/96  bone]
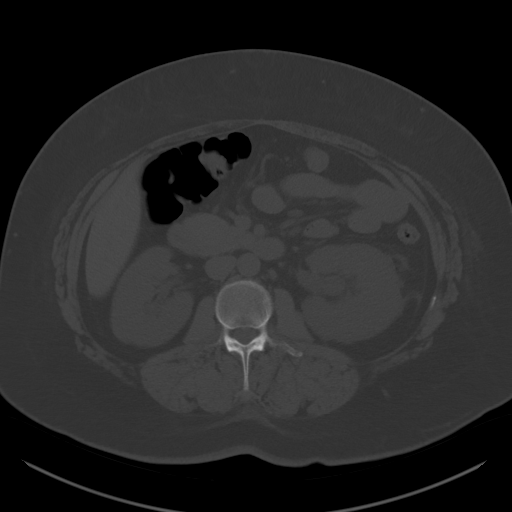
[im 71/96  soft-tissue]
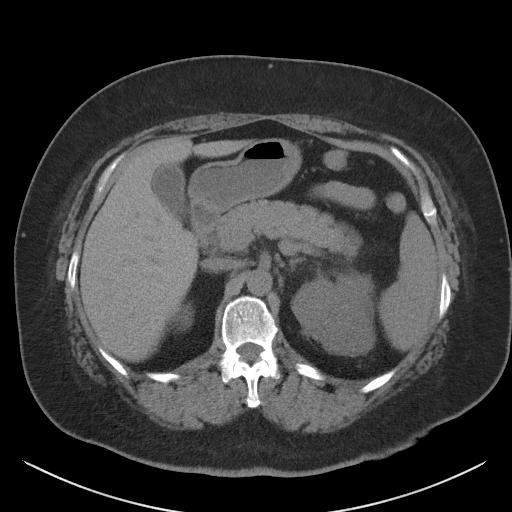
[im 75/96  soft-tissue]
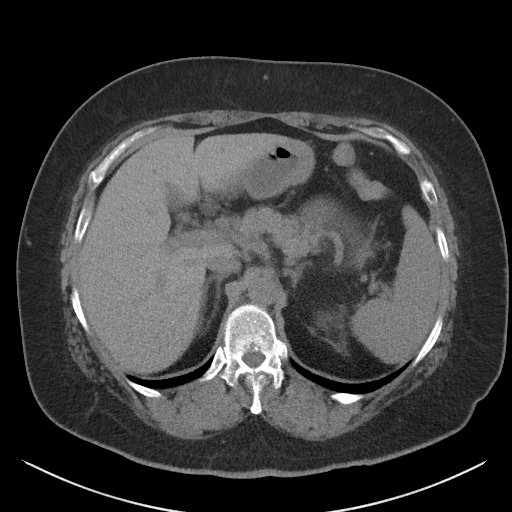
[im 83/96  soft-tissue]
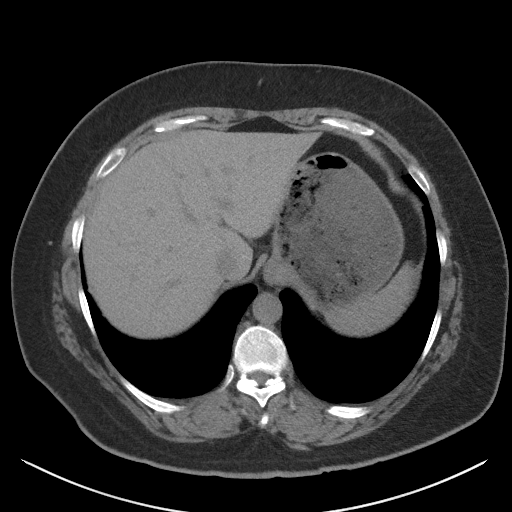
[im 91/96  soft-tissue]
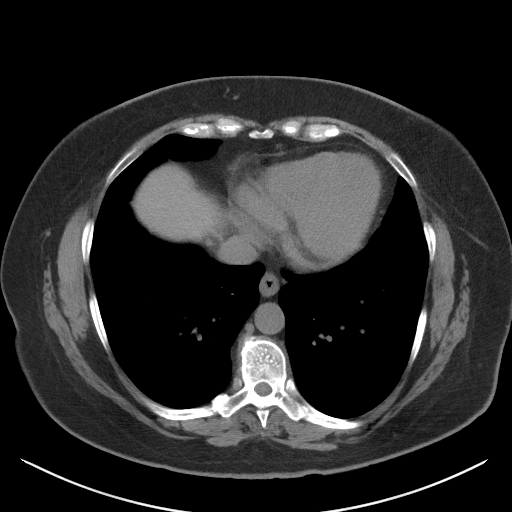

[Series 5: coronal · coronal · 0.80mm/px · 3 of 150 slices shown]
[im 50/150  soft-tissue]
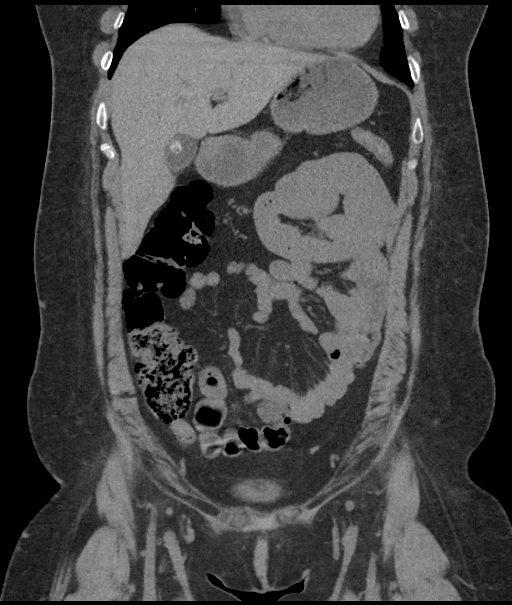
[im 67/150  soft-tissue]
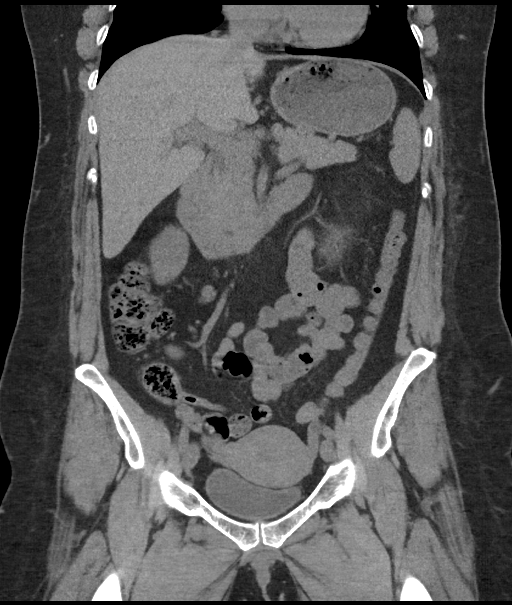
[im 83/150  soft-tissue]
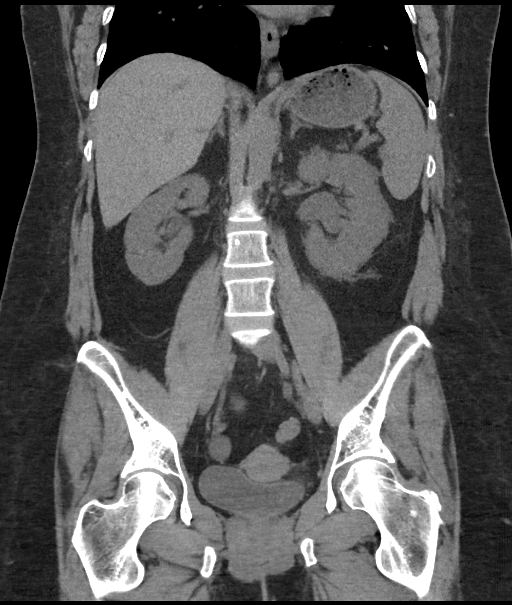

[16 of 46 positions shown; findings below may reference images not displayed]

FINDINGS: Lower chest: No pulmonary nodules or pleural effusion. No visible
pericardial effusion.

Hepatobiliary: Normal hepatic contours and density. No visible
biliary dilatation. Cholelithiasis without acute inflammation.

Pancreas: Normal contours without ductal dilatation. No
peripancreatic fluid collection.

Spleen: Normal.

Adrenals/Urinary Tract:

--Adrenal glands: Normal.

--Right kidney/ureter: No hydronephrosis or perinephric stranding.
No nephrolithiasis. No obstructing ureteral stones.

--Left kidney/ureter: There is an obstructing stone at the left
ureterovesical junction that measures 5 x 4 x 7 mm. This causes
moderate left hydroureteronephrosis and perinephric stranding. There
is a nonobstructing interpolar calculus measuring 3 mm.

--Urinary bladder: Unremarkable.

Stomach/Bowel:

--Stomach/Duodenum: No hiatal hernia or other gastric abnormality.
Normal duodenal course and caliber.

--Small bowel: No dilatation or inflammation.

--Colon: No focal abnormality.

--Appendix: Normal.

Vascular/Lymphatic: Normal course and caliber of the major abdominal
vessels. No abdominal or pelvic lymphadenopathy.

Reproductive: Cystic area at the lower uterine segment is slightly
larger, likely clustered nabothian cysts. No adnexal lesion.

Musculoskeletal. No bony spinal canal stenosis or focal osseous
abnormality.

Other: None.
IMPRESSION: 1. Left-sided obstructive uropathy with 5 x 4 x 7 mm obstructing
stone at the left ureterovesical junction causing moderate left
hydroureteronephrosis and perinephric stranding.
2. Second, nonobstructing left interpolar calculus measuring 3 mm.
3. No right nephrolithiasis.

## 2018-04-23 ENCOUNTER — Telehealth: Payer: Self-pay | Admitting: Obstetrics and Gynecology

## 2018-04-23 NOTE — Telephone Encounter (Signed)
Stephanie Kaufman referring for DUB, hx of uterine polyps, intolerance of hormonal BC. Previously saw SDJ  Called and left voicemail for patient to call back to be schedule.

## 2018-04-28 NOTE — Telephone Encounter (Signed)
Called and left voice mail for patient to call back to be schedule °

## 2018-04-30 NOTE — Telephone Encounter (Signed)
Called and left voicemail for patient to call back to be schedule. Letter to be seen. Contact pcp

## 2020-03-27 ENCOUNTER — Ambulatory Visit: Payer: Managed Care, Other (non HMO) | Admitting: Obstetrics and Gynecology

## 2022-09-26 ENCOUNTER — Encounter: Payer: Self-pay | Admitting: Family Medicine

## 2022-10-16 ENCOUNTER — Other Ambulatory Visit: Payer: Self-pay

## 2022-10-16 DIAGNOSIS — Z1231 Encounter for screening mammogram for malignant neoplasm of breast: Secondary | ICD-10-CM

## 2022-10-28 ENCOUNTER — Telehealth: Payer: Self-pay | Admitting: Hematology and Oncology

## 2022-10-28 ENCOUNTER — Ambulatory Visit: Payer: Self-pay | Attending: Hematology and Oncology

## 2022-10-28 ENCOUNTER — Ambulatory Visit: Payer: Self-pay

## 2022-11-08 ENCOUNTER — Other Ambulatory Visit: Payer: Self-pay

## 2022-11-08 MED ORDER — CYCLOBENZAPRINE HCL 10 MG PO TABS
10.0000 mg | ORAL_TABLET | Freq: Every evening | ORAL | 0 refills | Status: AC
  Filled 2022-11-08: qty 10, 10d supply, fill #0

## 2022-11-08 MED ORDER — NAPROXEN 500 MG PO TABS
500.0000 mg | ORAL_TABLET | Freq: Two times a day (BID) | ORAL | 0 refills | Status: AC
  Filled 2022-11-08: qty 28, 14d supply, fill #0

## 2024-01-20 ENCOUNTER — Other Ambulatory Visit (HOSPITAL_COMMUNITY): Payer: Self-pay
